# Patient Record
Sex: Male | Born: 1960
Health system: Southern US, Community
[De-identification: ages and names within clinical notes are randomized; demographics above are authoritative.]

## PROBLEM LIST (undated history)

## (undated) DIAGNOSIS — I10 Essential (primary) hypertension: Secondary | ICD-10-CM

## (undated) DIAGNOSIS — G7000 Myasthenia gravis without (acute) exacerbation: Secondary | ICD-10-CM

## (undated) DIAGNOSIS — E7800 Pure hypercholesterolemia, unspecified: Secondary | ICD-10-CM

## (undated) DIAGNOSIS — E119 Type 2 diabetes mellitus without complications: Secondary | ICD-10-CM

## (undated) HISTORY — DX: Essential (primary) hypertension: I10

## (undated) HISTORY — PX: WISDOM TOOTH EXTRACTION: SHX21

## (undated) HISTORY — DX: Myasthenia gravis without (acute) exacerbation: G70.00

## (undated) HISTORY — DX: Pure hypercholesterolemia, unspecified: E78.00

## (undated) HISTORY — PX: ELBOW SURGERY: SHX618

## (undated) HISTORY — DX: Type 2 diabetes mellitus without complications: E11.9

## (undated) HISTORY — PX: TYMPANOSTOMY TUBE PLACEMENT: SHX32

---

## 2003-06-29 ENCOUNTER — Ambulatory Visit (HOSPITAL_COMMUNITY): Admission: RE | Admit: 2003-06-29 | Discharge: 2003-06-29 | Payer: Self-pay | Admitting: Unknown Physician Specialty

## 2003-06-29 ENCOUNTER — Encounter: Payer: Self-pay | Admitting: Unknown Physician Specialty

## 2005-07-08 ENCOUNTER — Ambulatory Visit: Payer: Self-pay | Admitting: Family Medicine

## 2005-12-09 ENCOUNTER — Ambulatory Visit: Payer: Self-pay | Admitting: Family Medicine

## 2005-12-15 ENCOUNTER — Ambulatory Visit: Payer: Self-pay | Admitting: Family Medicine

## 2006-02-19 ENCOUNTER — Ambulatory Visit: Payer: Self-pay | Admitting: Family Medicine

## 2006-04-09 ENCOUNTER — Ambulatory Visit: Payer: Self-pay | Admitting: Family Medicine

## 2006-12-30 ENCOUNTER — Ambulatory Visit: Payer: Self-pay | Admitting: Family Medicine

## 2013-02-14 DIAGNOSIS — I10 Essential (primary) hypertension: Secondary | ICD-10-CM | POA: Insufficient documentation

## 2016-07-01 DIAGNOSIS — E785 Hyperlipidemia, unspecified: Secondary | ICD-10-CM | POA: Insufficient documentation

## 2016-07-01 DIAGNOSIS — F17210 Nicotine dependence, cigarettes, uncomplicated: Secondary | ICD-10-CM | POA: Insufficient documentation

## 2017-12-15 DIAGNOSIS — Z6832 Body mass index (BMI) 32.0-32.9, adult: Secondary | ICD-10-CM | POA: Diagnosis not present

## 2017-12-15 DIAGNOSIS — M25522 Pain in left elbow: Secondary | ICD-10-CM | POA: Diagnosis not present

## 2017-12-15 DIAGNOSIS — M7712 Lateral epicondylitis, left elbow: Secondary | ICD-10-CM | POA: Diagnosis not present

## 2018-01-15 DIAGNOSIS — E785 Hyperlipidemia, unspecified: Secondary | ICD-10-CM | POA: Diagnosis not present

## 2018-01-15 DIAGNOSIS — R799 Abnormal finding of blood chemistry, unspecified: Secondary | ICD-10-CM | POA: Diagnosis not present

## 2018-01-20 DIAGNOSIS — Z0000 Encounter for general adult medical examination without abnormal findings: Secondary | ICD-10-CM | POA: Diagnosis not present

## 2018-01-20 DIAGNOSIS — E785 Hyperlipidemia, unspecified: Secondary | ICD-10-CM | POA: Diagnosis not present

## 2018-01-20 DIAGNOSIS — I10 Essential (primary) hypertension: Secondary | ICD-10-CM | POA: Diagnosis not present

## 2018-01-20 DIAGNOSIS — F17210 Nicotine dependence, cigarettes, uncomplicated: Secondary | ICD-10-CM | POA: Diagnosis not present

## 2018-05-13 DIAGNOSIS — I10 Essential (primary) hypertension: Secondary | ICD-10-CM | POA: Diagnosis not present

## 2018-05-31 DIAGNOSIS — H35033 Hypertensive retinopathy, bilateral: Secondary | ICD-10-CM | POA: Diagnosis not present

## 2018-06-03 DIAGNOSIS — F17210 Nicotine dependence, cigarettes, uncomplicated: Secondary | ICD-10-CM | POA: Diagnosis not present

## 2018-06-03 DIAGNOSIS — I10 Essential (primary) hypertension: Secondary | ICD-10-CM | POA: Diagnosis not present

## 2018-06-03 DIAGNOSIS — K219 Gastro-esophageal reflux disease without esophagitis: Secondary | ICD-10-CM | POA: Diagnosis not present

## 2018-06-03 DIAGNOSIS — E785 Hyperlipidemia, unspecified: Secondary | ICD-10-CM | POA: Diagnosis not present

## 2018-06-07 DIAGNOSIS — H5310 Unspecified subjective visual disturbances: Secondary | ICD-10-CM | POA: Diagnosis not present

## 2018-06-07 DIAGNOSIS — H5010 Unspecified exotropia: Secondary | ICD-10-CM | POA: Diagnosis not present

## 2018-06-07 DIAGNOSIS — H04123 Dry eye syndrome of bilateral lacrimal glands: Secondary | ICD-10-CM | POA: Diagnosis not present

## 2018-06-07 DIAGNOSIS — H532 Diplopia: Secondary | ICD-10-CM | POA: Diagnosis not present

## 2018-09-27 DIAGNOSIS — I10 Essential (primary) hypertension: Secondary | ICD-10-CM | POA: Diagnosis not present

## 2018-09-27 DIAGNOSIS — E785 Hyperlipidemia, unspecified: Secondary | ICD-10-CM | POA: Diagnosis not present

## 2018-09-30 DIAGNOSIS — I10 Essential (primary) hypertension: Secondary | ICD-10-CM | POA: Diagnosis not present

## 2018-09-30 DIAGNOSIS — E785 Hyperlipidemia, unspecified: Secondary | ICD-10-CM | POA: Diagnosis not present

## 2018-09-30 DIAGNOSIS — K219 Gastro-esophageal reflux disease without esophagitis: Secondary | ICD-10-CM | POA: Diagnosis not present

## 2018-09-30 DIAGNOSIS — F17210 Nicotine dependence, cigarettes, uncomplicated: Secondary | ICD-10-CM | POA: Diagnosis not present

## 2018-11-30 DIAGNOSIS — H539 Unspecified visual disturbance: Secondary | ICD-10-CM | POA: Diagnosis not present

## 2018-11-30 DIAGNOSIS — I10 Essential (primary) hypertension: Secondary | ICD-10-CM | POA: Diagnosis not present

## 2018-11-30 DIAGNOSIS — H532 Diplopia: Secondary | ICD-10-CM | POA: Diagnosis not present

## 2018-12-01 DIAGNOSIS — H539 Unspecified visual disturbance: Secondary | ICD-10-CM | POA: Diagnosis not present

## 2018-12-01 DIAGNOSIS — H532 Diplopia: Secondary | ICD-10-CM | POA: Diagnosis not present

## 2018-12-02 DIAGNOSIS — I10 Essential (primary) hypertension: Secondary | ICD-10-CM | POA: Diagnosis not present

## 2018-12-02 DIAGNOSIS — E785 Hyperlipidemia, unspecified: Secondary | ICD-10-CM | POA: Diagnosis not present

## 2018-12-07 DIAGNOSIS — H02401 Unspecified ptosis of right eyelid: Secondary | ICD-10-CM | POA: Diagnosis not present

## 2018-12-22 ENCOUNTER — Encounter: Payer: Self-pay | Admitting: *Deleted

## 2018-12-23 ENCOUNTER — Other Ambulatory Visit: Payer: Self-pay

## 2018-12-23 ENCOUNTER — Encounter: Payer: Self-pay | Admitting: *Deleted

## 2018-12-23 ENCOUNTER — Ambulatory Visit: Payer: BLUE CROSS/BLUE SHIELD | Admitting: Neurology

## 2018-12-23 ENCOUNTER — Encounter: Payer: Self-pay | Admitting: Neurology

## 2018-12-23 VITALS — BP 165/85 | HR 65 | Temp 98.0°F | Ht 68.0 in | Wt 206.0 lb

## 2018-12-23 DIAGNOSIS — I10 Essential (primary) hypertension: Secondary | ICD-10-CM

## 2018-12-23 DIAGNOSIS — H05823 Myopathy of extraocular muscles, bilateral: Secondary | ICD-10-CM

## 2018-12-23 DIAGNOSIS — H02401 Unspecified ptosis of right eyelid: Secondary | ICD-10-CM

## 2018-12-23 DIAGNOSIS — H532 Diplopia: Secondary | ICD-10-CM

## 2018-12-23 DIAGNOSIS — Z87891 Personal history of nicotine dependence: Secondary | ICD-10-CM

## 2018-12-23 DIAGNOSIS — G7000 Myasthenia gravis without (acute) exacerbation: Secondary | ICD-10-CM

## 2018-12-23 MED ORDER — PYRIDOSTIGMINE BROMIDE 60 MG PO TABS: ORAL_TABLET | ORAL | 6 refills | Status: DC

## 2018-12-23 NOTE — Patient Instructions (Signed)
MRI of the brain CT of the chest Labwork today Start Mestinon(pyridostigmine) and may consider other medications after evaluation above    Myasthenia Gravis Myasthenia gravis (MG) is a long-term (chronic) condition that causes weakness in the muscles you can control (voluntary muscles). MG can affect any voluntary muscle. The muscles most often affected are the ones that control:  Eye movement.  Facial movements.  Swallowing. MG is a disease in which the body's disease-fighting system (immune system) attacks its own healthy tissues (autoimmune disease). When you have MG, your immune system makes proteins (antibodies) that block the chemical (acetylcholine) that your body needs to send nerve signals to your muscles. This causes muscle weakness. What are the causes? The exact cause of MG is not known. What increases the risk? The following factors may make you more likely to develop this condition:  Having an enlarged thymus gland. The thymus gland is located under the breastbone. It makes certain cells for the immune system.  Having a family history of MG. What are the signs or symptoms? Symptoms of MG may include:  Drooping eyelids.  Double vision.  Muscle weakness that gets worse with activity and gets better after rest.  Difficulty walking.  Trouble chewing and swallowing.  Trouble making facial expressions.  Slurred speech.  Weakness of the arms, hands, and legs. Sudden, severe difficulty breathing (myasthenic crisis) may develop after having:  An infection.  A fever.  A bad reaction to a medicine. Myasthenic crisis requires emergency breathing support. Sometimes symptoms of MG go away for a while (remission) and then come back later. How is this diagnosed? This condition may be diagnosed based on:  Your symptoms and medical history.  A physical exam.  Blood tests.  Tests of your muscle strength and function.  Imaging tests, such as a CT scan or an  MRI. How is this treated? The goal of treatment is to improve muscle strength. Treatment may include:  Taking medicine.  Making lifestyle changes that focus on saving your energy.  Doing physical therapy to gain strength.  Having surgery to remove the thymus gland (thymectomy). This may result in a long remission for some people.  Having a procedure to remove the acetylcholine antibodies (plasmapheresis).  Getting emergency breathing support, if you experience myasthenic crisis. If you experience remission, you may be able to stop treatment and then resume treatment when your symptoms return. Follow these instructions at home:   Take over-the-counter and prescription medicines only as told by your health care provider.  Get plenty of rest and sleep. Take frequent breaks to rest your eyes, especially when in bright light or working on a computer.  Maintain a healthy diet and a healthy weight. Work with your health care provider or a diet and nutrition specialist (dietitian) if you need help.  Do exercises as told by your health care provider or physical therapist.  Do not use any products that contain nicotine or tobacco, such as cigarettes and e-cigarettes. If you need help quitting, ask your health care provider.  Prevent infections by: ? Washing your hands often with soap and water. If soap and water are not available, use hand sanitizer. ? Avoiding contact with other people who are sick. ? Avoiding touching your eyes, nose, and mouth. ? Cleaning surfaces in your home that are touched often using a disinfectant.  Keep all follow-up visits as told by your health care provider. This is important. Contact a health care provider if:  Your symptoms change or get worse,  especially after having a fever or infection. Get help right away if:  You have trouble breathing. Summary  Myasthenia gravis (MG) is a long-term (chronic) condition that causes weakness in the muscles you can  control (voluntary muscles).  A symptom of MG is muscle weakness that gets worse with activity and gets better after rest.  Sudden, severe difficulty breathing (myasthenic crisis) may develop after having an infection, a fever, or a bad reaction to a medicine.  The goal of treatment is to improve muscle strength. Treatment may include medicines, lifestyle changes, physical therapy, surgery, plasmapheresis, or emergency breathing support. This information is not intended to replace advice given to you by your health care provider. Make sure you discuss any questions you have with your health care provider. Document Released: 12/15/2000 Document Revised: 09/21/2017 Document Reviewed: 09/21/2017 Elsevier Interactive Patient Education  2019 [employer].   Pyridostigmine tablets, regular release What is this medicine? PYRIDOSTIGMINE (peer id oh STIG meen) can help with muscle strength. It is used to treat myasthenia gravis. This medicine may be used for other purposes; ask your health care provider or pharmacist if you have questions. COMMON BRAND NAME(S): Mestinon What should I tell my health care provider before I take this medicine? They need to know if you have any of these conditions: -asthma -difficulty passing urine -heart disease -infection in abdomen, peritonitis -irregular, slow heartbeat -kidney disease -seizures -stomach or bowel obstruction or ulcers -thyroid disease -an unusual or allergic reaction to pyridostigmine, bromides, other medicines, foods, dyes, or preservatives -pregnant or trying to get pregnant -breast-feeding How should I use this medicine? Take this medicine by mouth with a glass of water. Follow the directions on the prescription label. Take your medicine at regular intervals. Do not take your medicine more often than directed. Do not stop taking except on your doctor's advice. Talk to your pediatrician regarding the use of this medicine in children.  Special care may be needed. Overdosage: If you think you have taken too much of this medicine contact a poison control center or emergency room at once. NOTE: This medicine is only for you. Do not share this medicine with others. What if I miss a dose? If you miss a dose, take it as soon as you can. If it is almost time for your next dose, take only that dose. Do not take double or extra doses. What may interact with this medicine? Do not take this medicine with any of the following medications: -other medicines for myasthenia gravis like neostigmine -quinine This medicine may also interact with the following medications: -atropine -bethanechol -disopyramide -edrophonium -guanadrel -guanethidine -mecamylamine -medicines that block muscle or nerve pain This list may not describe all possible interactions. Give your health care provider a list of all the medicines, herbs, non-prescription drugs, or dietary supplements you use. Also tell them if you smoke, drink alcohol, or use illegal drugs. Some items may interact with your medicine. What should I watch for while using this medicine? Visit your doctor or health care professional for regular checks on your progress. Tell your doctor if your symptoms do not improve or if they get worse. Wear a medical ID bracelet or chain, and carry a card that describes your disease and details of your medicine and dosage times. What side effects may I notice from receiving this medicine? Side effects that you should report to your doctor or health care professional as soon as possible: -allergic reactions like skin rash, itching or hives, swelling of the face,  lips, or tongue -breathing problems -changes in vision -muscle cramps, spasm -slow or irregular heartbeat -stomach cramps, pain -unusually weak or tired -vomiting Side effects that usually do not require medical attention (report to your doctor or health care professional if they continue or are  bothersome): -diarrhea, especially at start of treatment -increased saliva -increased sweating -nausea This list may not describe all possible side effects. Call your doctor for medical advice about side effects. You may report side effects to FDA at 1-800-FDA-1088. Where should I keep my medicine? Keep out of the reach of children. Store at room temperature between 15 and 30 degrees C (59 and 86 degrees F). Keep container tightly closed. Throw away any unused medicine after the expiration date. NOTE: This sheet is a summary. It may not cover all possible information. If you have questions about this medicine, talk to your doctor, pharmacist, or health care provider.  2019 Elsevier/Gold Standard (2008-04-06 15:11:50)
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
## 2018-12-27 ENCOUNTER — Encounter: Payer: Self-pay | Admitting: Neurology

## 2018-12-27 DIAGNOSIS — I10 Essential (primary) hypertension: Secondary | ICD-10-CM

## 2018-12-27 DIAGNOSIS — G7000 Myasthenia gravis without (acute) exacerbation: Secondary | ICD-10-CM | POA: Insufficient documentation

## 2018-12-28 ENCOUNTER — Telehealth: Payer: Self-pay | Admitting: Neurology

## 2018-12-28 NOTE — Telephone Encounter (Signed)
Noted, thank you

## 2018-12-28 NOTE — Telephone Encounter (Signed)
[employer] Auth: [id number] (exp. 12/28/18 to 06/25/19) order sent to GI. They will reach out to the pt to schedule.

## 2018-12-28 NOTE — Telephone Encounter (Signed)
Are you doing the CT Chest w contrast for ocular Myasthenia gravis or what is the reason?

## 2018-12-28 NOTE — Telephone Encounter (Signed)
Yes mtasthenia gravis looking for thymoma or malignancy that can produce the antibodies that cause the disease

## 2018-12-30 LAB — HEMOGLOBIN A1C
Est. average glucose Bld gHb Est-mCnc: 128 mg/dL
Hgb A1c MFr Bld: 6.1 % — ABNORMAL HIGH (ref 4.8–5.6)

## 2018-12-30 LAB — VGCC ANTIBODY: VGCC Antibody: NEGATIVE

## 2018-12-30 LAB — ACETYLCHOLINE RECEPTOR, BLOCKING: Acetylchol Block Ab: 25 % (ref 0–25)

## 2018-12-30 LAB — B12 AND FOLATE PANEL
Folate: 6.2 ng/mL (ref >3.0)
Vitamin B-12: 559 pg/mL (ref 232–1245)

## 2018-12-30 LAB — CK: Total CK: 217 U/L — ABNORMAL HIGH (ref 24–204)

## 2018-12-30 LAB — METHYLMALONIC ACID, SERUM: Methylmalonic Acid: 277 nmol/L (ref 0–378)

## 2018-12-30 LAB — ACETYLCHOLINE RECEPTOR, MODULATING: Acetylcholine Modulat Ab: <12 % (ref 0–20)

## 2018-12-30 LAB — VITAMIN B1: Thiamine: 160.1 nmol/L (ref 66.5–200.0)

## 2018-12-30 LAB — SEDIMENTATION RATE: Sed Rate: 22 mm/hr (ref 0–30)

## 2018-12-30 LAB — C-REACTIVE PROTEIN: CRP: 1 mg/L (ref 0–10)

## 2018-12-30 LAB — VITAMIN B6: Vitamin B6: 8.6 ug/L (ref 5.3–46.7)

## 2018-12-30 LAB — ACETYLCHOLINE RECEPTOR, BINDING: AChR Binding Ab, Serum: <0.03 nmol/L (ref 0.00–0.24)

## 2019-01-03 ENCOUNTER — Ambulatory Visit
Admission: RE | Admit: 2019-01-03 | Discharge: 2019-01-03 | Disposition: A | Discharge status: Home / Self Care | Payer: BLUE CROSS/BLUE SHIELD | Source: Ambulatory Visit | Attending: Neurology | Admitting: Neurology

## 2019-01-03 ENCOUNTER — Encounter: Payer: Self-pay | Admitting: *Deleted

## 2019-01-03 ENCOUNTER — Telehealth: Payer: Self-pay | Admitting: *Deleted

## 2019-01-03 ENCOUNTER — Other Ambulatory Visit: Payer: Self-pay

## 2019-01-03 DIAGNOSIS — H02401 Unspecified ptosis of right eyelid: Secondary | ICD-10-CM

## 2019-01-03 DIAGNOSIS — H532 Diplopia: Secondary | ICD-10-CM

## 2019-01-03 DIAGNOSIS — H05823 Myopathy of extraocular muscles, bilateral: Secondary | ICD-10-CM

## 2019-01-03 DIAGNOSIS — Z87891 Personal history of nicotine dependence: Secondary | ICD-10-CM

## 2019-01-03 DIAGNOSIS — I700 Atherosclerosis of aorta: Secondary | ICD-10-CM | POA: Diagnosis not present

## 2019-01-03 MED ORDER — IOPAMIDOL (ISOVUE-300) INJECTION 61%
75.0000 mL | Freq: Once | INTRAVENOUS | Status: AC | PRN
  Administered 2019-01-03: 75 mL via INTRAVENOUS

## 2019-01-03 MED ORDER — GADOBENATE DIMEGLUMINE 529 MG/ML IV SOLN
19.0000 mL | Freq: Once | INTRAVENOUS | Status: AC | PRN
  Administered 2019-01-03: 12:00:00 19 mL via INTRAVENOUS

## 2019-01-03 NOTE — Telephone Encounter (Signed)
-----   Message from Melvenia Beam, MD sent at 01/02/2019 10:44 AM EDT ----- Patient has pre-diabetes. The rest of the labs were unremarkable. One lab for Myasthenia Gravis was borderline. As explained, the myasthenia gravis labs are often negative in ocular myasthenia gravis. We still have MRI pending. thanks

## 2019-01-03 NOTE — Telephone Encounter (Signed)
Messaged pt through his active mychart.

## 2019-01-04 NOTE — Telephone Encounter (Signed)
Faxed work [employer] to [employer] per [employer] request. Received a receipt of confirmation.

## 2019-01-05 ENCOUNTER — Encounter: Payer: Self-pay | Admitting: *Deleted

## 2019-01-10 ENCOUNTER — Ambulatory Visit (INDEPENDENT_AMBULATORY_CARE_PROVIDER_SITE_OTHER): Payer: BLUE CROSS/BLUE SHIELD | Admitting: Neurology

## 2019-01-10 ENCOUNTER — Other Ambulatory Visit: Payer: Self-pay

## 2019-01-10 DIAGNOSIS — G7000 Myasthenia gravis without (acute) exacerbation: Secondary | ICD-10-CM
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
## 2019-01-10 NOTE — Telephone Encounter (Signed)
Called pt & LVM asking for call back to update his chart before his phone appt with Dr. Jaynee Eagles at 11:30 this morning. Left office number for call back.

## 2019-01-11 ENCOUNTER — Encounter: Payer: Self-pay | Admitting: Neurology

## 2019-01-12 DIAGNOSIS — Z0289 Encounter for other administrative examinations: Secondary | ICD-10-CM

## 2019-01-12 NOTE — Telephone Encounter (Signed)
FMLA form for absence from work (12 weeks) starting 12/23/2018 with estimated return to work date 03/24/2019 completed, signed and sent to medical records for processing.

## 2019-01-13 ENCOUNTER — Telehealth: Payer: Self-pay | Admitting: *Deleted

## 2019-01-13 NOTE — Telephone Encounter (Signed)
Pt [employer] form faxed on 01/13/19

## 2019-01-17 ENCOUNTER — Encounter: Payer: Self-pay | Admitting: *Deleted

## 2019-01-17 ENCOUNTER — Telehealth: Payer: Self-pay | Admitting: Neurology

## 2019-01-17 NOTE — Telephone Encounter (Signed)
Pt states that the pyridostigmine (MESTINON) 60 MG tablet is not working as it did before. Pt was to f/u in 4 weeks but is thinking he should be seen sooner due to the medication not working the way it was. Please call daughter per pt. Please advise.

## 2019-01-17 NOTE — Telephone Encounter (Signed)
I sent pt a mychart message asking for an updated number for his daughter.

## 2019-01-17 NOTE — Telephone Encounter (Signed)
Spoke with pt's daughter Enriqueta Shutter. Per daughter, pt started 1/2 tablet of mestinon q3 hours for 3-4 weeks, doing ok on it, then felt like his body was used to it. Increased to 1 tablet every 3 hours however that's not helping as good as original use of 1/2 tablet. Pt also wants to know when to f/u. Per Joycelyn Schmid, pt to return to work May 18th but was under impression appt was needed before then. Also made note that if patient bends over and then sits back up or is reclining and sits up, he gets blurred vision in his R eye. Closes eye 1-2 minutes then it's better. He is not dizzy or "swimmy headed" when this happens. I advised message would be sent to Dr. Jaynee Eagles and I will call back. She verbalized appreciation.

## 2019-01-18 ENCOUNTER — Telehealth: Payer: Self-pay | Admitting: Neurology

## 2019-01-18 ENCOUNTER — Other Ambulatory Visit: Payer: Self-pay | Admitting: Neurology

## 2019-01-18 DIAGNOSIS — H5460 Unqualified visual loss, one eye, unspecified: Secondary | ICD-10-CM

## 2019-01-18 DIAGNOSIS — G453 Amaurosis fugax: Secondary | ICD-10-CM

## 2019-01-18 DIAGNOSIS — H53122 Transient visual loss, left eye: Secondary | ICD-10-CM

## 2019-01-18 NOTE — Telephone Encounter (Signed)
[employer] Auth: [id number] (exp. 01/18/19 to 07/16/19) order sent to [facility]. They will reach out to the pt to schedule.

## 2019-01-18 NOTE — Telephone Encounter (Signed)
I would like to see him in the office early next week. I need more information about his blurry vision. I would advice increased fluid as it sounds orthostatic. But I would like to make sure the blood vessels in his head are ok, if there is a lood vessel issue it can cause blurry vision when not enough blood can get to the eye. I will order a CTA of the head and neck thanks

## 2019-01-18 NOTE — Telephone Encounter (Signed)
I spoke with pt's daughter Enriqueta Shutter. Discussed Dr. Cathren Laine message in detail including encouragement to drink more fluids in case this is orthostatic, also ordered CT-A to check the blood vessels in head and neck as decreased blood flow to eye can cause blurred vision. She verbalized understanding and will let pt know. She confirmed pt hasn't had any fever, respiratory symptoms, has not traveled, only left house to go to the store, no known exposure to [place]. Per Dr.Ahern, CT-A not needed before visit. Scheduled pt for ov Monday 5/4 @ 11:00 AM. She is aware they will need to bring their own mask and gloves and visitor accompaniment is limited, only pt allowed back. She verbalized appreciation for the call.

## 2019-01-24 ENCOUNTER — Ambulatory Visit
Admission: RE | Admit: 2019-01-24 | Discharge: 2019-01-24 | Disposition: A | Discharge status: Home / Self Care | Payer: BLUE CROSS/BLUE SHIELD | Source: Ambulatory Visit | Attending: Neurology | Admitting: Neurology

## 2019-01-24 ENCOUNTER — Other Ambulatory Visit: Payer: BLUE CROSS/BLUE SHIELD

## 2019-01-24 ENCOUNTER — Ambulatory Visit: Payer: BLUE CROSS/BLUE SHIELD | Admitting: Neurology

## 2019-01-24 ENCOUNTER — Encounter: Payer: Self-pay | Admitting: *Deleted

## 2019-01-24 ENCOUNTER — Encounter: Payer: Self-pay | Admitting: Neurology

## 2019-01-24 ENCOUNTER — Other Ambulatory Visit: Payer: Self-pay

## 2019-01-24 VITALS — BP 143/87 | HR 62 | Temp 99.5°F

## 2019-01-24 DIAGNOSIS — H53122 Transient visual loss, left eye: Secondary | ICD-10-CM

## 2019-01-24 DIAGNOSIS — H5460 Unqualified visual loss, one eye, unspecified: Secondary | ICD-10-CM

## 2019-01-24 DIAGNOSIS — G7000 Myasthenia gravis without (acute) exacerbation: Secondary | ICD-10-CM | POA: Diagnosis not present

## 2019-01-24 DIAGNOSIS — Z79899 Other long term (current) drug therapy: Secondary | ICD-10-CM

## 2019-01-24 DIAGNOSIS — G453 Amaurosis fugax: Secondary | ICD-10-CM | POA: Diagnosis not present

## 2019-01-24 MED ORDER — MYCOPHENOLATE MOFETIL 500 MG PO TABS: 500.0000 mg | ORAL_TABLET | Freq: Two times a day (BID) | ORAL | 6 refills | Status: DC

## 2019-01-24 MED ORDER — IOPAMIDOL (ISOVUE-370) INJECTION 76%
75.0000 mL | Freq: Once | INTRAVENOUS | Status: AC | PRN
  Administered 2019-01-24: 75 mL via INTRAVENOUS
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
## 2019-01-24 NOTE — Patient Instructions (Signed)
Treatment Strategies & Goals Myasthenia gravis (pronounced `my??s?`th??n???? `gr?v??s), also known simply as MG, is a rare neuromuscular disorder. When the first case of MG was documented in 1672 by Mason Jim, an [facility] physician, not much was known or understood about it. Today, we know there are multiple causes for MG as well as treatment options. The most common form of MG is a chronic autoimmune neuromuscular disorder that is characterized by fluctuating weakness of the voluntary muscle groups.     Treatment Goals While there is no known cure for myasthenia gravis (MG), there are many effective treatments. Spontaneous improvement and even remission, although uncommon, may occur without any specific therapy. However, as every case of MG is unique, you and your doctor will decide on a treatment plan for your specific needs.     In preparation for your doctor's visit, you can review and bring a copy of the MG treatment guidelines with you. The MG treatment guidelines are the result of a three-year effort to develop agreement among an international group of MG experts on the use of various treatments for people with MG. These guidelines were developed with leadership from our Medical and Scientific Advisory Board members and published in the April 17, 2015 issue of Neurology, entitled the "International Consensus Guidance for Management of Myasthenia Gravis." This paper is a significant new resource for physicians caring for MG patients. To view the webinar on MG Treatment Guidelines, please click here.     With treatment, you may expect to see modest to significant improvement in your strength, helping you lead a full life. New treatments continue to give hope to people with MG, their family and their caregivers. Information on available treatments is below.     Medications Anti-acetylcholinesterase agents - Mestinon  (pyridiostigmine bromide)- allows acetylcholine to remain at the  neuromuscular junction for a longer period, which in turn allows activation of more receptor sites, resulting in increased conductivity and muscle engagement. Mestinon comes in two forms, fast-acting 60 mg tablets and long-lasting slow-release 180 mg capsules known as Timespan, which delivers pyridiostigmine bromide over a 12-hour period.  Corticosteroids and immunosuppressant agents -  Corticosteroids, such as Prednisone or an immunosuppressant agent such as Imuran, Cellcept, or Cyclosporin , may be prescribed by your doctor as a stand-alone medication or in combination. These medications suppress your body's production of antibodies that may be blocking or binding onto your body's acetylcholine receptors. This blocking or binding of the acetylcholine receptors causes weakness. Monoclonal antibodies - Monoclonal antibodies such as Soliris, generic name Eculizumab or Rituximab, are the newest classification of infusible drugs to be FDA-approved for MG treatment. These drugs work as complement inhibitors, to reduce immune system attacks that may contribute to MG symptoms. You and your doctor will decide if this approach is best for you.  Cautionary Drugs - Certain medications may cause worsening of MG symptoms. Remember to tell your doctors and dentists about your MG diagnosis and the medications you are currently taking. It is important to check with your doctor before starting any new medication, including over the counter medications or preparations.   Other Treatment Strategies Intravenous immune globulins (IVIg) - Your doctor may prescribe IVIG as part of your treatment regimen. During IVIG, a line is placed into a vein to receive delivery of immune globulins (IgG). A typical IVIG infusion may take from 4- 8 hours and is typically in a hospital setting. This influx of IgG is thought to override your own antibody production (which may be  causing your weakness) while providing you protection from  possible infections. Results are often temporary, so repeated treatments are required. IgG Sub-cue Hizentra- A new, less invasive method of delivering immune globulins (IgG) is being reviewed for MG use. This method is known as IgG sub-cue, which means subcutaneous or "under the skin". In this method of IgG delivery, a series of 4-6 short needles are placed into the subcutaneous layer of skin across your abdomen. These needles are connected in a spider web fashion to the pump. A typical sub-cue infusion may only take from 1-3 hours and can be done at home. Therapeutic Plasma Exchange, or Plasmapheresis - Also known as Plasma exchange or PLEX. This is a filtration procedure whereby abnormal antibodies are removed from blood plasma. This procedure requires two intravenous (IV) lines or a port placed before undergoing PLEX.  Your doctor may decide you need this treatment to quickly improve strength prior to surgery.  As your body continually produces antibodies, repeated PLEX treatments may be required. Thymectomy - This is the surgical removal of the thymus gland. The thymus gland is located in the middle of your upper chest and lies over your heart. This gland plays a role in the production of antibodies. While it is most active in early childhood, the thymus gland usually shrinks over time and by early adulthood is believed to no longer function. Sometimes, the thymus gland remains large and continues to be active in antibody production. Some MG patients may develop a tumor in the thymus gland know as a thymoma. Your doctor may suggest a thymectomy for you.   Effectiveness of this surgical procedure varies with each patient. Weakness may be reduced, and sometimes  Mycophenolate capsules What is this medicine? MYCOPHENOLATE MOFETIL (mye koe FEN oh late MOE fe til) is used to decrease the immune system's response to a transplanted organ. This medicine may be used for other purposes; ask your health care  provider or pharmacist if you have questions. COMMON BRAND NAME(S): CellCept What should I tell my health care provider before I take this medicine? They need to know if you have any of these conditions: -anemia or other blood disorder -diarrhea -immune system problems -infection -kidney disease -phenylketonuria -stomach problems -an unusual or allergic reaction to mycophenolate mofetil, other medicines, foods, dyes, or preservatives -pregnant or trying to get pregnant -breast-feeding How should I use this medicine? Take this medicine by mouth with a full glass of water. Follow the directions on the prescription label. Take this medicine on an empty stomach, at least 1 hour before or 2 hours after food. Do not take with food unless your doctor approves. Swallow the medicine whole. Do not cut, crush, or chew the medicine. If the medicine is broken or is not intact, do not get the powder on your skin or eyes. If contact occurs, rinse thoroughly with water. Take your medicine at regular intervals. Do not take your medicine more often than directed. Do not stop taking except on your doctor's advice. A special MedGuide will be given to you by the pharmacist with each prescription and refill. Be sure to read this information carefully each time. Talk to your pediatrician regarding the use of this medicine in children. Special care may be needed. Overdosage: If you think you have taken too much of this medicine contact a poison control center or emergency room at once. NOTE: This medicine is only for you. Do not share this medicine with others. What if I miss a dose? If  you miss a dose, take it as soon as you can. If it is almost time for your next dose, take only that dose. Do not take double or extra doses. What may interact with this medicine? -acyclovir or valacyclovir -antacids -azathioprine -birth control pills -certain antibiotics like ciprofloxacin and amoxicillin; clavulanic  acid -ganciclovir or valganciclovir -lanthanum carbonate -medicines for cholesterol like cholestyramine and colestipol -metronidazole -norfloxacin -other mycophenolate medicines -probenecid -rifampin -sevelamer -vaccines This list may not describe all possible interactions. Give your health care provider a list of all the medicines, herbs, non-prescription drugs, or dietary supplements you use. Also tell them if you smoke, drink alcohol, or use illegal drugs. Some items may interact with your medicine. What should I watch for while using this medicine? Visit your doctor or health care professional for regular checks on your progress. You will need frequent blood checks during the first few months you are receiving the medicine. This medicine can make you more sensitive to the sun. Keep out of the sun. If you cannot avoid being in the sun, wear protective clothing and use sunscreen. Do not use sun lamps or tanning beds/booths. This medicine can cause birth defects. Do not get pregnant while taking this drug. Females will need to have a negative pregnancy test before starting this medicine. If sexually active, use 2 reliable forms of birth control together for 4 weeks before starting this medicine, while you are taking this medicine, and for 6 weeks after you stop taking this medicine. Birth control pills alone may not work properly while you are taking this medicine. If you think that you might be pregnant talk to your doctor right away. If you get a cold or other infection while receiving this medicine, call your doctor or health care professional. Do not treat yourself. The medicine may decrease your body's ability to fight infections. What side effects may I notice from receiving this medicine? Side effects that you should report to your doctor or health care professional as soon as possible: -allergic reactions like skin rash, itching or hives, swelling of the face, lips, or tongue -bloody,  dark, or tarry stools -changes in vision -dizziness -fever, chills or any other sign of infection -unusual bleeding or bruising -unusually weak or tired Side effects that usually do not require medical attention (report to your doctor or health care professional if they continue or are bothersome): -constipation -diarrhea -difficulty sleeping -loss of appetite -nausea, vomiting This list may not describe all possible side effects. Call your doctor for medical advice about side effects. You may report side effects to FDA at 1-800-FDA-1088. Where should I keep my medicine? Keep out of the reach of children. Store at room temperature between 15 and 30 degrees C (59 and 86 degrees F). Throw away any unused medicine after the expiration date. NOTE: This sheet is a summary. It may not cover all possible information. If you have questions about this medicine, talk to your doctor, pharmacist, or health care provider.  2019 Elsevier/Gold Standard (2008-03-20 09:25:30)

## 2019-01-25 LAB — CBC WITH DIFFERENTIAL/PLATELET
Basophils Absolute: 0.1 10*3/uL (ref 0.0–0.2)
Basos: 1 %
EOS (ABSOLUTE): 0.3 10*3/uL (ref 0.0–0.4)
Eos: 3 %
Hematocrit: 49.6 % (ref 37.5–51.0)
Hemoglobin: 17.2 g/dL (ref 13.0–17.7)
Immature Grans (Abs): 0 10*3/uL (ref 0.0–0.1)
Immature Granulocytes: 0 %
Lymphocytes Absolute: 2.3 10*3/uL (ref 0.7–3.1)
Lymphs: 22 %
MCH: 30.8 pg (ref 26.6–33.0)
MCHC: 34.7 g/dL (ref 31.5–35.7)
MCV: 89 fL (ref 79–97)
Monocytes Absolute: 0.5 10*3/uL (ref 0.1–0.9)
Monocytes: 5 %
Neutrophils Absolute: 7.3 10*3/uL — ABNORMAL HIGH (ref 1.4–7.0)
Neutrophils: 69 %
Platelets: 269 10*3/uL (ref 150–450)
RBC: 5.58 x10E6/uL (ref 4.14–5.80)
RDW: 13.5 % (ref 11.6–15.4)
WBC: 10.4 10*3/uL (ref 3.4–10.8)

## 2019-01-25 LAB — COMPREHENSIVE METABOLIC PANEL
ALT: 25 IU/L (ref 0–44)
AST: 31 IU/L (ref 0–40)
Albumin/Globulin Ratio: 2.1 (ref 1.2–2.2)
Albumin: 4.9 g/dL (ref 3.8–4.9)
Alkaline Phosphatase: 93 IU/L (ref 39–117)
BUN/Creatinine Ratio: 15 (ref 9–20)
BUN: 12 mg/dL (ref 6–24)
Bilirubin Total: 0.6 mg/dL (ref 0.0–1.2)
CO2: 20 mmol/L (ref 20–29)
Calcium: 10.2 mg/dL (ref 8.7–10.2)
Chloride: 99 mmol/L (ref 96–106)
Creatinine, Ser: 0.78 mg/dL (ref 0.76–1.27)
GFR calc Af Amer: 116 mL/min/{1.73_m2} (ref >59)
GFR calc non Af Amer: 100 mL/min/{1.73_m2} (ref >59)
Globulin, Total: 2.3 g/dL (ref 1.5–4.5)
Glucose: 97 mg/dL (ref 65–99)
Potassium: 4.6 mmol/L (ref 3.5–5.2)
Sodium: 138 mmol/L (ref 134–144)
Total Protein: 7.2 g/dL (ref 6.0–8.5)

## 2019-01-25 LAB — AMYLASE: Amylase: 50 U/L (ref 31–110)

## 2019-01-26 DIAGNOSIS — K219 Gastro-esophageal reflux disease without esophagitis: Secondary | ICD-10-CM | POA: Diagnosis not present

## 2019-01-26 DIAGNOSIS — G7000 Myasthenia gravis without (acute) exacerbation: Secondary | ICD-10-CM | POA: Diagnosis not present

## 2019-01-26 DIAGNOSIS — I10 Essential (primary) hypertension: Secondary | ICD-10-CM | POA: Diagnosis not present

## 2019-02-08 ENCOUNTER — Ambulatory Visit: Payer: BLUE CROSS/BLUE SHIELD | Admitting: Neurology

## 2019-02-15 ENCOUNTER — Telehealth: Payer: Self-pay | Admitting: Neurology

## 2019-02-15 NOTE — Telephone Encounter (Signed)
I spoke with the patient. He stated the medicine is not helping. I advised per Dr. Jaynee Eagles, increase the Mycophenolate (cellcept) to 1000 mg (2 tablets) twice daily and then we will f/u at his appt Monday. Pt verbalized understanding and his questions were answered. He is aware that per Dr. Jaynee Eagles, labs will be needed next week so he preferred to come into the office for the visit rather than do telemedicine. He understands the check in process including the covid19 screening questions and mask requirement. He verbalized appreciation.  Appt note updated to reflect this is an in-office visit.

## 2019-02-15 NOTE — Telephone Encounter (Signed)
Pt states the medicine for his eye problem is not working well and he is wanting to know if there is something else that he can be put on. Please advise.

## 2019-02-21 ENCOUNTER — Ambulatory Visit: Payer: BLUE CROSS/BLUE SHIELD | Admitting: Neurology

## 2019-02-21 ENCOUNTER — Encounter: Payer: Self-pay | Admitting: Neurology

## 2019-02-21 ENCOUNTER — Other Ambulatory Visit: Payer: Self-pay

## 2019-02-21 VITALS — BP 173/93 | HR 65 | Temp 98.2°F | Ht 69.0 in | Wt 205.0 lb

## 2019-02-21 DIAGNOSIS — G7000 Myasthenia gravis without (acute) exacerbation: Secondary | ICD-10-CM

## 2019-02-21 DIAGNOSIS — G7001 Myasthenia gravis with (acute) exacerbation: Secondary | ICD-10-CM | POA: Diagnosis not present

## 2019-02-21 MED ORDER — MYCOPHENOLATE MOFETIL 500 MG PO TABS: 1000.0000 mg | ORAL_TABLET | Freq: Two times a day (BID) | ORAL | 4 refills | Status: DC

## 2019-02-21 MED ORDER — PREDNISONE 20 MG PO TABS: 60.0000 mg | ORAL_TABLET | Freq: Every day | ORAL | 1 refills | Status: DC

## 2019-02-21 NOTE — Patient Instructions (Signed)
Start prednisone. 60mg  daily, take in the morning with food watch for GI upset Stop Mestinon (pyridostigmine)   Prednisone tablets What is this medicine? PREDNISONE (PRED ni sone) is a corticosteroid. It is commonly used to treat inflammation of the skin, joints, lungs, and other organs. Common conditions treated include asthma, allergies, and arthritis. It is also used for other conditions, such as blood disorders and diseases of the adrenal glands. This medicine may be used for other purposes; ask your health care provider or pharmacist if you have questions. COMMON BRAND NAME(S): Deltasone, Predone, Sterapred, Sterapred DS What should I tell my health care provider before I take this medicine? They need to know if you have any of these conditions: -Cushing's syndrome -diabetes -glaucoma -heart disease -high blood pressure -infection (especially a virus infection such as chickenpox, cold sores, or herpes) -kidney disease -liver disease -mental illness -myasthenia gravis -osteoporosis -seizures -stomach or intestine problems -thyroid disease -an unusual or allergic reaction to lactose, prednisone, other medicines, foods, dyes, or preservatives -pregnant or trying to get pregnant -breast-feeding How should I use this medicine? Take this medicine by mouth with a glass of water. Follow the directions on the prescription label. Take this medicine with food. If you are taking this medicine once a day, take it in the morning. Do not take more medicine than you are told to take. Do not suddenly stop taking your medicine because you may develop a severe reaction. Your doctor will tell you how much medicine to take. If your doctor wants you to stop the medicine, the dose may be slowly lowered over time to avoid any side effects. Talk to your pediatrician regarding the use of this medicine in children. Special care may be needed. Overdosage: If you think you have taken too much of this medicine  contact a poison control center or emergency room at once. NOTE: This medicine is only for you. Do not share this medicine with others. What if I miss a dose? If you miss a dose, take it as soon as you can. If it is almost time for your next dose, talk to your doctor or health care professional. You may need to miss a dose or take an extra dose. Do not take double or extra doses without advice. What may interact with this medicine? Do not take this medicine with any of the following medications: -metyrapone -mifepristone This medicine may also interact with the following medications: -aminoglutethimide -amphotericin B -aspirin and aspirin-like medicines -barbiturates -certain medicines for diabetes, like glipizide or glyburide -cholestyramine -cholinesterase inhibitors -cyclosporine -digoxin -diuretics -ephedrine -male hormones, like estrogens and birth control pills -isoniazid -ketoconazole -NSAIDS, medicines for pain and inflammation, like ibuprofen or naproxen -phenytoin -rifampin -toxoids -vaccines -warfarin This list may not describe all possible interactions. Give your health care provider a list of all the medicines, herbs, non-prescription drugs, or dietary supplements you use. Also tell them if you smoke, drink alcohol, or use illegal drugs. Some items may interact with your medicine. What should I watch for while using this medicine? Visit your doctor or health care professional for regular checks on your progress. If you are taking this medicine over a prolonged period, carry an identification card with your name and address, the type and dose of your medicine, and your doctor's name and address. This medicine may increase your risk of getting an infection. Tell your doctor or health care professional if you are around anyone with measles or chickenpox, or if you develop sores or blisters that  do not heal properly. If you are going to have surgery, tell your doctor or  health care professional that you have taken this medicine within the last twelve months. Ask your doctor or health care professional about your diet. You may need to lower the amount of salt you eat. This medicine may increase blood sugar. Ask your healthcare provider if changes in diet or medicines are needed if you have diabetes. What side effects may I notice from receiving this medicine? Side effects that you should report to your doctor or health care professional as soon as possible: -allergic reactions like skin rash, itching or hives, swelling of the face, lips, or tongue -changes in emotions or moods -changes in vision -depressed mood -eye pain -fever or chills, cough, sore throat, pain or difficulty passing urine -signs and symptoms of high blood sugar such as being more thirsty or hungry or having to urinate more than normal. You may also feel very tired or have blurry vision. -swelling of ankles, feet Side effects that usually do not require medical attention (report to your doctor or health care professional if they continue or are bothersome): -confusion, excitement, restlessness -headache -nausea, vomiting -skin problems, acne, thin and shiny skin -trouble sleeping -weight gain This list may not describe all possible side effects. Call your doctor for medical advice about side effects. You may report side effects to FDA at 1-800-FDA-1088. Where should I keep my medicine? Keep out of the reach of children. Store at room temperature between 15 and 30 degrees C (59 and 86 degrees F). Protect from light. Keep container tightly closed. Throw away any unused medicine after the expiration date. NOTE: This sheet is a summary. It may not cover all possible information. If you have questions about this medicine, talk to your doctor, pharmacist, or health care provider.  2019 Elsevier/Gold Standard (2018-06-08 10:54:22)
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
## 2019-02-22 ENCOUNTER — Telehealth: Payer: Self-pay | Admitting: *Deleted

## 2019-02-22 ENCOUNTER — Telehealth: Payer: Self-pay | Admitting: Neurology

## 2019-02-22 ENCOUNTER — Encounter: Payer: Self-pay | Admitting: Neurology

## 2019-02-22 DIAGNOSIS — G7001 Myasthenia gravis with (acute) exacerbation: Secondary | ICD-10-CM | POA: Insufficient documentation

## 2019-02-22 LAB — COMPREHENSIVE METABOLIC PANEL
ALT: 22 IU/L (ref 0–44)
AST: 25 IU/L (ref 0–40)
Albumin/Globulin Ratio: 2.2 (ref 1.2–2.2)
Albumin: 4.7 g/dL (ref 3.8–4.9)
Alkaline Phosphatase: 84 IU/L (ref 39–117)
BUN/Creatinine Ratio: 12 (ref 9–20)
BUN: 10 mg/dL (ref 6–24)
Bilirubin Total: 0.5 mg/dL (ref 0.0–1.2)
CO2: 22 mmol/L (ref 20–29)
Calcium: 9.7 mg/dL (ref 8.7–10.2)
Chloride: 99 mmol/L (ref 96–106)
Creatinine, Ser: 0.83 mg/dL (ref 0.76–1.27)
GFR calc Af Amer: 113 mL/min/{1.73_m2} (ref >59)
GFR calc non Af Amer: 98 mL/min/{1.73_m2} (ref >59)
Globulin, Total: 2.1 g/dL (ref 1.5–4.5)
Glucose: 106 mg/dL — ABNORMAL HIGH (ref 65–99)
Potassium: 4.2 mmol/L (ref 3.5–5.2)
Sodium: 138 mmol/L (ref 134–144)
Total Protein: 6.8 g/dL (ref 6.0–8.5)

## 2019-02-22 LAB — CBC WITH DIFFERENTIAL/PLATELET
Basophils Absolute: 0.1 10*3/uL (ref 0.0–0.2)
Basos: 1 %
EOS (ABSOLUTE): 0.3 10*3/uL (ref 0.0–0.4)
Eos: 4 %
Hematocrit: 50.5 % (ref 37.5–51.0)
Hemoglobin: 17.1 g/dL (ref 13.0–17.7)
Immature Grans (Abs): 0 10*3/uL (ref 0.0–0.1)
Immature Granulocytes: 0 %
Lymphocytes Absolute: 2.3 10*3/uL (ref 0.7–3.1)
Lymphs: 31 %
MCH: 31 pg (ref 26.6–33.0)
MCHC: 33.9 g/dL (ref 31.5–35.7)
MCV: 92 fL (ref 79–97)
Monocytes Absolute: 0.5 10*3/uL (ref 0.1–0.9)
Monocytes: 7 %
Neutrophils Absolute: 4.3 10*3/uL (ref 1.4–7.0)
Neutrophils: 57 %
Platelets: 254 10*3/uL (ref 150–450)
RBC: 5.51 x10E6/uL (ref 4.14–5.80)
RDW: 13.5 % (ref 11.6–15.4)
WBC: 7.4 10*3/uL (ref 3.4–10.8)

## 2019-02-22 NOTE — Telephone Encounter (Signed)
Per Dr. Jaynee Eagles, start IVIG with loading dose of 2 g/kg over 3 days via IV followed by 1 g/kg over 1-2 days as tolerated by patient every 4 weeks via IV. Pre-treatment of tylenol PO 650 mg x1 as well as Benadryl (can be PO or IV or IM) 50 mg x 1.   I called Tiffany RN w/ [facility] and advised new orders pending for IVIG. Discussed our office will send office notes, labs, scan results, insurance information as they will be doing insurance authorization. All documents mentioned above printed and ready to be faxed to [employer]. Orders also written for IVIG and pre-meds as noted above. Fax number for [facility] is [phone]. Tiffany stated she will confirm receipt of fax.

## 2019-02-22 NOTE — Telephone Encounter (Signed)
IVIG orders and supporting documents faxed to [facility] @ [facility]. Received a receipt of confirmation from fax machine.

## 2019-02-22 NOTE — Telephone Encounter (Signed)
Received request from [facility] re: dx code for Mycophenolate. I provided the code G70.00 and faxed form back to [facility]. Received a receipt of confirmation.

## 2019-02-22 NOTE — Telephone Encounter (Signed)
Freda Munro ( Disability Claims Nurse) is calling in requesting a call back concerning some clinical question  CB# [phone]

## 2019-02-23 NOTE — Telephone Encounter (Signed)
Tiffany from [facility] called and confirmed receipt of IVIG orders.

## 2019-02-23 NOTE — Telephone Encounter (Signed)
Spoke with Freda Munro. She wanted to confirm pt's office dates. I advised of all 2020 OVs on 4/2, 4/20, 5/4, 6/1. She asked for status update. I advised pt's symptoms worsened. We are adding new medication. Return to work estimated 03/24/2019, 12 weeks total. She had no further questions.

## 2019-02-23 NOTE — Telephone Encounter (Signed)
Returned [employer] call and LVM asking for call back. Left office number and hours in message.

## 2019-02-24 NOTE — Telephone Encounter (Signed)
Received IVIG orders for Dr. Jaynee Eagles to sign.   IVIG 180 grams IV in divided doses over 3 days ONE TIME followed by maintenance dose of 90 grams IV in divided doses over 1-2 days every 4 weeks. Pre-medication is Tylenol 650 mg PO 30 minutes prior to infusion. Also Benadryl 25 mg tablet or capsule 30 minutes prior to infusion. Other orders included Anaphylaxis kit PRN (Epinephrine, Benadryl and 0.9% NaCl), nursing orders for education and administration and IV flushes before and after medication.   Orders signed by Dr. Jaynee Eagles and returned via fax to [facility]. Received a receipt of confirmation.

## 2019-03-02 NOTE — Telephone Encounter (Signed)
Per Tiffany @ [facility], IVIG has been approved. They will ship out medication as soon as patient can provide a start date.

## 2019-03-14 ENCOUNTER — Telehealth: Payer: Self-pay | Admitting: Neurology

## 2019-03-14 NOTE — Telephone Encounter (Signed)
Pt's return to work date has been estimated for 03/24/2019 and was confirmed with disability case manager on 02/23/2019. I will send to Dr. Jaynee Eagles to confirm no changes have been made.

## 2019-03-14 NOTE — Telephone Encounter (Signed)
Pt daughter has called to inform RN Romelle Starcher that there is an additional form that needs to be completed this week if [name] is going to keep pt out of work.  The daughter is faxing in the form,

## 2019-03-14 NOTE — Telephone Encounter (Signed)
I am going to keep him out of work another 6 months, he still has significant ocular disease and needs IVIG

## 2019-03-16 NOTE — Telephone Encounter (Signed)
I have not receive any paperwork on this patient.

## 2019-03-16 NOTE — Telephone Encounter (Signed)
I called Margaret back. She stated the forms had been faxed to the main fax number on Monday. She will fax again, this time to [phone]. She understands the office will reach out for payment. Also aware Dr. Jaynee Eagles is going to keep patient out of work for another 6 months. She verbalized appreciation for the call.

## 2019-03-17 ENCOUNTER — Telehealth: Payer: Self-pay | Admitting: *Deleted

## 2019-03-17 ENCOUNTER — Encounter: Payer: Self-pay | Admitting: *Deleted

## 2019-03-17 NOTE — Telephone Encounter (Signed)
I spoke with the patient after speaking with Dr. Jaynee Eagles, and I offered to r/s him 4 weeks out if he is doing better on the steroids and IVIG. Pt stated his eyes were much better however he still has a few questions for Dr. Jaynee Eagles. He also stated he had not started the IVIG yet because he thought Dr. Jaynee Eagles would want to see how he is doing on the steroids before starting IVIG. He understands Dr. Jaynee Eagles is taking him out of work another 6 months. He needs a letter for work to give them in addition to the disability paperwork that will be needed. I advised we had not received the forms that his daughter said she would fax to [place]. Patient will come in for his appointment Monday to discuss. He passed the [place] screening questions and will bring his mask. He verbalized appreciation for the call.

## 2019-03-17 NOTE — Telephone Encounter (Signed)
Opened in error

## 2019-03-21 ENCOUNTER — Ambulatory Visit (INDEPENDENT_AMBULATORY_CARE_PROVIDER_SITE_OTHER): Payer: BC Managed Care – PPO | Admitting: Neurology

## 2019-03-21 ENCOUNTER — Encounter: Payer: Self-pay | Admitting: Neurology

## 2019-03-21 ENCOUNTER — Other Ambulatory Visit: Payer: Self-pay

## 2019-03-21 ENCOUNTER — Encounter: Payer: Self-pay | Admitting: *Deleted

## 2019-03-21 VITALS — BP 168/89 | HR 63 | Temp 98.4°F | Ht 69.0 in | Wt 205.0 lb

## 2019-03-21 DIAGNOSIS — G7000 Myasthenia gravis without (acute) exacerbation: Secondary | ICD-10-CM

## 2019-03-21 DIAGNOSIS — Z79899 Other long term (current) drug therapy: Secondary | ICD-10-CM | POA: Diagnosis not present
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
## 2019-03-21 NOTE — Patient Instructions (Addendum)
Decrease prednisone by 5mg  every 2 weeks Week 1: 55mg  (5.5 tabs) Week 2: 50mg  (5 tabs) Week 3: 45mg  (4.5 tabes) Week 4: 40mg  (4 tabs)  Follow up 6 weeks

## 2019-03-22 ENCOUNTER — Telehealth: Payer: Self-pay | Admitting: *Deleted

## 2019-03-22 ENCOUNTER — Telehealth: Payer: Self-pay | Admitting: Neurology

## 2019-03-22 LAB — COMPREHENSIVE METABOLIC PANEL
ALT: 23 IU/L (ref 0–44)
AST: 19 IU/L (ref 0–40)
Albumin/Globulin Ratio: 2.3 — ABNORMAL HIGH (ref 1.2–2.2)
Albumin: 4.6 g/dL (ref 3.8–4.9)
Alkaline Phosphatase: 72 IU/L (ref 39–117)
BUN/Creatinine Ratio: 19 (ref 9–20)
BUN: 17 mg/dL (ref 6–24)
Bilirubin Total: 0.4 mg/dL (ref 0.0–1.2)
CO2: 20 mmol/L (ref 20–29)
Calcium: 9.5 mg/dL (ref 8.7–10.2)
Chloride: 96 mmol/L (ref 96–106)
Creatinine, Ser: 0.89 mg/dL (ref 0.76–1.27)
GFR calc Af Amer: 110 mL/min/{1.73_m2} (ref >59)
GFR calc non Af Amer: 95 mL/min/{1.73_m2} (ref >59)
Globulin, Total: 2 g/dL (ref 1.5–4.5)
Glucose: 137 mg/dL — ABNORMAL HIGH (ref 65–99)
Potassium: 4.4 mmol/L (ref 3.5–5.2)
Sodium: 138 mmol/L (ref 134–144)
Total Protein: 6.6 g/dL (ref 6.0–8.5)

## 2019-03-22 LAB — CBC WITH DIFFERENTIAL/PLATELET
Basophils Absolute: 0 10*3/uL (ref 0.0–0.2)
Basos: 0 %
EOS (ABSOLUTE): 0.2 10*3/uL (ref 0.0–0.4)
Eos: 2 %
Hematocrit: 47.5 % (ref 37.5–51.0)
Hemoglobin: 16.7 g/dL (ref 13.0–17.7)
Immature Grans (Abs): 0 10*3/uL (ref 0.0–0.1)
Immature Granulocytes: 1 %
Lymphocytes Absolute: 3.2 10*3/uL — ABNORMAL HIGH (ref 0.7–3.1)
Lymphs: 36 %
MCH: 32.1 pg (ref 26.6–33.0)
MCHC: 35.2 g/dL (ref 31.5–35.7)
MCV: 91 fL (ref 79–97)
Monocytes Absolute: 0.6 10*3/uL (ref 0.1–0.9)
Monocytes: 7 %
Neutrophils Absolute: 4.9 10*3/uL (ref 1.4–7.0)
Neutrophils: 54 %
Platelets: 229 10*3/uL (ref 150–450)
RBC: 5.2 x10E6/uL (ref 4.14–5.80)
RDW: 13.6 % (ref 11.6–15.4)
WBC: 8.9 10*3/uL (ref 3.4–10.8)

## 2019-03-22 LAB — HEMOGLOBIN A1C
Est. average glucose Bld gHb Est-mCnc: 151 mg/dL
Hgb A1c MFr Bld: 6.9 % — ABNORMAL HIGH (ref 4.8–5.6)

## 2019-03-22 NOTE — Telephone Encounter (Signed)
Received short term disability form from pt. Form completed and sent to medical records for processing. Dr. Jaynee Eagles keeping patient out of work for the next 6 months. Return to work est. 09/26/2019.  Form received today. Due on 03/23/2019.

## 2019-03-22 NOTE — Telephone Encounter (Signed)
Would you call patient and let him know the following please:  Mr. Yim, your HgbA1c was elevated. Prior to starting the steroids, you were pre-diabetic. Now you are in the mild diabetic range with an average glucose of 151 daily. If you can get in to see a primary care soon to treat that would be great otherwise we will have to taper you off of the steroids more quickly and watch you very carefully.I can also call a colleague in internal medicine and see what they recommend to treat the elevated glucose (let me know if you get an appointment within the next 4 weeks with a primary care). If you have any unusual symptoms go to the ED (Nausea, vomiting, abdominal pain, fatigue, dry mouth, excessive urination). We will check labs again at next appointment. Continue to taper the steroids at the schedule we discussed (decrease by 5mg  or 1/2 a pill every 2 weeks). Thanks.

## 2019-03-22 NOTE — Telephone Encounter (Signed)
Pt [employer] form fax on 03/22/19.

## 2019-03-23 NOTE — Telephone Encounter (Signed)
I spoke with the patient and reviewed Dr. Cathren Laine message with pt about his Hgba1c, when to go to ED and that she advises pt see PCP. He has an appt on 8/18 with PCP and he will keep calling to see if he can be seen sooner. He will call our office back Monday if he hasn't gotten a sooner appointment. He verbalized understanding of the message and the plan. He will continue decrease his steroids every 2 weeks per plan.

## 2019-04-14 ENCOUNTER — Other Ambulatory Visit: Payer: Self-pay | Admitting: Neurology

## 2019-04-14 DIAGNOSIS — G7000 Myasthenia gravis without (acute) exacerbation: Secondary | ICD-10-CM

## 2019-04-14 MED ORDER — PREDNISONE 10 MG PO TABS: ORAL_TABLET | ORAL | 3 refills | Status: DC

## 2019-04-21 ENCOUNTER — Telehealth: Payer: Self-pay | Admitting: General Practice

## 2019-04-21 DIAGNOSIS — G7001 Myasthenia gravis with (acute) exacerbation: Secondary | ICD-10-CM | POA: Diagnosis not present

## 2019-04-21 NOTE — Telephone Encounter (Signed)
Patient has already been rescheduled for a sooner appt per wife.

## 2019-04-22 ENCOUNTER — Other Ambulatory Visit: Payer: Self-pay

## 2019-04-22 ENCOUNTER — Telehealth: Payer: Self-pay | Admitting: General Practice

## 2019-04-25 ENCOUNTER — Ambulatory Visit: Payer: BC Managed Care – PPO | Admitting: Family Medicine

## 2019-04-25 ENCOUNTER — Encounter: Payer: Self-pay | Admitting: Family Medicine

## 2019-04-25 ENCOUNTER — Other Ambulatory Visit: Payer: Self-pay

## 2019-04-25 VITALS — BP 143/76 | HR 76 | Temp 99.2°F | Ht 69.0 in | Wt 189.0 lb

## 2019-04-25 DIAGNOSIS — T380X5A Adverse effect of glucocorticoids and synthetic analogues, initial encounter: Secondary | ICD-10-CM

## 2019-04-25 DIAGNOSIS — E099 Drug or chemical induced diabetes mellitus without complications: Secondary | ICD-10-CM | POA: Diagnosis not present

## 2019-04-25 DIAGNOSIS — Z7689 Persons encountering health services in other specified circumstances: Secondary | ICD-10-CM | POA: Diagnosis not present

## 2019-04-25 DIAGNOSIS — Z720 Tobacco use: Secondary | ICD-10-CM

## 2019-04-25 DIAGNOSIS — I10 Essential (primary) hypertension: Secondary | ICD-10-CM

## 2019-04-25 DIAGNOSIS — G7000 Myasthenia gravis without (acute) exacerbation: Secondary | ICD-10-CM | POA: Insufficient documentation

## 2019-04-25 DIAGNOSIS — R252 Cramp and spasm: Secondary | ICD-10-CM

## 2019-04-25 LAB — BAYER DCA HB A1C WAIVED: HB A1C (BAYER DCA - WAIVED): 10.3 % — ABNORMAL HIGH (ref <7.0)

## 2019-04-25 MED ORDER — METFORMIN HCL 500 MG PO TABS: ORAL_TABLET | ORAL | 0 refills | Status: DC

## 2019-04-25 NOTE — Progress Notes (Signed)
Subjective: NO:BSJGGEZMO care, elevated BGs HPI: Kenneth Fields is a 58 y.o. male presenting to clinic today for:  1. Elevated Blood sugar Patient notes that he has had elevated blood sugars at home anywhere between 200 and 400s.  His most recent blood sugar this morning was in the 300s.  He discussed this with his neurologist and they recommended that he establish care with a primary care doctor ASAP as this was likely drug-induced diabetes.  He notes that he was started on prednisone about a month ago and initially was started at high dose but has since tapered down to 20 mg daily.  There is plans to transition over to a infusion for his myasthenia but this has not yet been started.  He does report polydipsia, polyuria, lower extremity cramping and occasional blurry vision.  He was seen by an eye doctor earlier this year.  He was diagnosed with myasthenia gravis about 4 months ago.  His initial symptoms were droopy eyelid, double vision. No known family hx of MG.  He does report weight loss that is unintended.  He started at about 205 and is now down to 189.  2.  Tobacco use disorder Patient reports 40+ year history of 1 pack/day smoking.  He notes he tried Chantix but could not tolerated secondary to change in his mental health and mood.  He is tried patches before but unfortunately use these before bed and was having nightmares from them.  He is considered retrying them but is not ready to do this yet.  Denies shortness of breath, chest pain or hemoptysis.  He does report unplanned weight loss as above since blood sugars have been elevated.  Past Medical History:  Diagnosis Date  . High cholesterol   . Hypertension   . Myasthenia gravis [facility])    Past Surgical History:  Procedure Laterality Date  . ELBOW SURGERY Right    for tendonitis  . TYMPANOSTOMY TUBE PLACEMENT Bilateral    as a child   . WISDOM TOOTH EXTRACTION     Social History   Socioeconomic History  . Marital status:  Married    Spouse name: Not on file  . Number of children: Not on file  . Years of education: Not on file  . Highest education level: 11th grade  Occupational History  . Not on file  Social Needs  . Financial resource strain: Not on file  . Food insecurity    Worry: Not on file    Inability: Not on file  . Transportation needs    Medical: Not on file    Non-medical: Not on file  Tobacco Use  . Smoking status: Current Every Day Smoker    Packs/day: 1.00    Years: 1.00    Pack years: 1.00    Types: Cigarettes  . Smokeless tobacco: Never Used  Substance and Sexual Activity  . Alcohol use: Yes    Alcohol/week: 14.0 - 21.0 standard drinks    Types: 14 - 21 Cans of beer per week  . Drug use: Never  . Sexual activity: Not on file  Lifestyle  . Physical activity    Days per week: Not on file    Minutes per session: Not on file  . Stress: Not on file  Relationships  . Social Herbalist on phone: Not on file    Gets together: Not on file    Attends religious service: Not on file    Active member of club  or organization: Not on file    Attends meetings of clubs or organizations: Not on file    Relationship status: Not on file  . Intimate partner violence    Fear of current or ex partner: Not on file    Emotionally abused: Not on file    Physically abused: Not on file    Forced sexual activity: Not on file  Other Topics Concern  . Not on file  Social History Narrative   Live at home with his wife   Right handed   Caffeine: 1 diet mtn dew daily, occasional tea    Current Meds  Medication Sig  . aspirin EC 81 MG tablet Take 81 mg by mouth daily.  . benazepril (LOTENSIN) 20 MG tablet Take 20 mg by mouth daily.  . cetirizine (ZYRTEC) 10 MG tablet Take 10 mg by mouth daily as needed for allergies.  Marland Kitchen diltiazem (CARDIZEM CD) 120 MG 24 hr capsule Take 120 mg by mouth daily.  . mycophenolate (CELLCEPT) 500 MG tablet Take 2 tablets (1,000 mg total) by mouth 2 (two)  times daily. May further increase at patient's next appointment  . predniSONE (DELTASONE) 10 MG tablet Decrease prednisone by 77m every 2 weeks: 510m(5.5 tabs) daily, 5025m5 tabs)daily, 21m50m.5 tabs) daily, 40mg41mtabs) daily with food (Patient taking differently: Take 20 mg by mouth daily with breakfast. )  . pyridostigmine (MESTINON) 60 MG tablet Take 120 mg by mouth. Three times daily (6 AM, lunch, 6 PM).  . rosuvastatin (CRESTOR) 10 MG tablet Take 10 mg by mouth daily.   Family History  Problem Relation Age of Onset  . Lung cancer Mother   . Cancer Mother    Not on File  ROS: Per HPI  Objective: Office vital signs reviewed. BP (!) 143/76   Pulse 76   Temp 99.2 F (37.3 C) (Temporal)   Ht _0  (1.753 m)   Wt 189 lb (85.7 kg)   BMI 27.91 kg/m   Physical Examination:  General: Awake, alert, well nourished, No acute distress HEENT: Normal, sclera white. MMM Cardio: regular rate and rhythm, S1S2 heard, no murmurs appreciated Pulm: clear to auscultation bilaterally, no wheezes, rhonchi or rales; normal work of breathing on room air Extremities: warm, well perfused, No edema, cyanosis or clubbing; +2 pulses bilaterally MSK: normal gait and station Skin: dry; intact; no rashes or lesions Neuro: no facial droop. No focal neurologic deficits.  Assessment/ Plan: 57 y.66 male   1. 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  Expect that this is probably gone to be elevated given fasting blood sugars well into the 2 and 300s at home.  Start metformin.  May be able to come off of this medication once he is discontinued the prednisone.  He will follow-up in 1 month with fasting blood sugar log.  If needed we will plan to increase dose to 1000 mg twice daily at that time.  I suspect that his unplanned weight loss is secondary to catabolic state but if he has persistent weight loss despite controlled blood sugar low threshold to obtain CT of the chest  given ongoing smoking. - Bayer DCA Hb A1c Waived - CMP14+EGFR - metFORMIN (GLUCOPHAGE) 500 MG tablet; Take 1 tablet (500 mg total) by mouth daily with breakfast for 14 days, THEN 1 tablet (500 mg total) 2 (two) times daily with a meal.  Dispense: 180 tablet; Refill: 0  2. Establishing care with new doctor, encounter  for I reviewed his laboratory results in the database.  He has had prediabetes for quite some time now.   3. Myasthenia gravis ([place]) Followed by Dr. Lavell Anchors.  Plan to transition over infusion from prednisone soon.  4. Leg cramping Likely secondary to fluid loss from polyuria due to diabetes.  Check metabolic panel and magnesium - CMP14+EGFR - Magnesium  5. Essential hypertension with goal blood pressure less than 130/80 Not at goal but may be situational anxiety secondary to establishing care.  Will recheck in 1 month and if persistently elevated plan to augment his medication regimen.  Could consider increasing benazepril versus Cardizem.  6. Tobacco use Contemplative.  Failed Chantix secondary to side effects.  We discussed consideration for revisiting patches.  He will consider this  [name], [place] [phone]

## 2019-04-25 NOTE — Patient Instructions (Signed)
You had labs performed today.  You will be contacted with the results of the labs once they are available, usually in the next 3 business days for routine lab work.  If you have an active my chart account, they will be released to your MyChart.  If you prefer to have these labs released to you via telephone, please let us know.  If you had a pap smear or biopsy performed, expect to be contacted in about 7-10 days.   Diabetes Mellitus and Nutrition, Adult When you have diabetes (diabetes mellitus), it is very important to have healthy eating habits because your blood sugar (glucose) levels are greatly affected by what you eat and drink. Eating healthy foods in the appropriate amounts, at about the same times every day, can help you:  Control your blood glucose.  Lower your risk of heart disease.  Improve your blood pressure.  Reach or maintain a healthy weight. Every person with diabetes is different, and each person has different needs for a meal plan. Your health care provider may recommend that you work with a diet and nutrition specialist (dietitian) to make a meal plan that is best for you. Your meal plan may vary depending on factors such as:  The calories you need.  The medicines you take.  Your weight.  Your blood glucose, blood pressure, and cholesterol levels.  Your activity level.  Other health conditions you have, such as heart or kidney disease. How do carbohydrates affect me? Carbohydrates, also called carbs, affect your blood glucose level more than any other type of food. Eating carbs naturally raises the amount of glucose in your blood. Carb counting is a method for keeping track of how many carbs you eat. Counting carbs is important to keep your blood glucose at a healthy level, especially if you use insulin or take certain oral diabetes medicines. It is important to know how many carbs you can safely have in each meal. This is different for every person. Your dietitian  can help you calculate how many carbs you should have at each meal and for each snack. Foods that contain carbs include:  Bread, cereal, rice, pasta, and crackers.  Potatoes and corn.  Peas, beans, and lentils.  Milk and yogurt.  Fruit and juice.  Desserts, such as cakes, cookies, ice cream, and candy. How does alcohol affect me? Alcohol can cause a sudden decrease in blood glucose (hypoglycemia), especially if you use insulin or take certain oral diabetes medicines. Hypoglycemia can be a life-threatening condition. Symptoms of hypoglycemia (sleepiness, dizziness, and confusion) are similar to symptoms of having too much alcohol. If your health care provider says that alcohol is safe for you, follow these guidelines:  Limit alcohol intake to no more than 1 drink per day for nonpregnant women and 2 drinks per day for men. One drink equals 12 oz of beer, 5 oz of wine, or 1 oz of hard liquor.  Do not drink on an empty stomach.  Keep yourself hydrated with water, diet soda, or unsweetened iced tea.  Keep in mind that regular soda, juice, and other mixers may contain a lot of sugar and must be counted as carbs. What are tips for following this plan?  Reading food labels  Start by checking the serving size on the "Nutrition Facts" label of packaged foods and drinks. The amount of calories, carbs, fats, and other nutrients listed on the label is based on one serving of the item. Many items contain more than one  serving per package.  Check the total grams (g) of carbs in one serving. You can calculate the number of servings of carbs in one serving by dividing the total carbs by 15. For example, if a food has 30 g of total carbs, it would be equal to 2 servings of carbs.  Check the number of grams (g) of saturated and trans fats in one serving. Choose foods that have low or no amount of these fats.  Check the number of milligrams (mg) of salt (sodium) in one serving. Most people should  limit total sodium intake to less than 2,300 mg per day.  Always check the nutrition information of foods labeled as "low-fat" or "nonfat". These foods may be higher in added sugar or refined carbs and should be avoided.  Talk to your dietitian to identify your daily goals for nutrients listed on the label. Shopping  Avoid buying canned, premade, or processed foods. These foods tend to be high in fat, sodium, and added sugar.  Shop around the outside edge of the grocery store. This includes fresh fruits and vegetables, bulk grains, fresh meats, and fresh dairy. Cooking  Use low-heat cooking methods, such as baking, instead of high-heat cooking methods like deep frying.  Cook using healthy oils, such as olive, canola, or sunflower oil.  Avoid cooking with butter, cream, or high-fat meats. Meal planning  Eat meals and snacks regularly, preferably at the same times every day. Avoid going long periods of time without eating.  Eat foods high in fiber, such as fresh fruits, vegetables, beans, and whole grains. Talk to your dietitian about how many servings of carbs you can eat at each meal.  Eat 4-6 ounces (oz) of lean protein each day, such as lean meat, chicken, fish, eggs, or tofu. One oz of lean protein is equal to: ? 1 oz of meat, chicken, or fish. ? 1 egg. ?  cup of tofu.  Eat some foods each day that contain healthy fats, such as avocado, nuts, seeds, and fish. Lifestyle  Check your blood glucose regularly.  Exercise regularly as told by your health care provider. This may include: ? 150 minutes of moderate-intensity or vigorous-intensity exercise each week. This could be brisk walking, biking, or water aerobics. ? Stretching and doing strength exercises, such as yoga or weightlifting, at least 2 times a week.  Take medicines as told by your health care provider.  Do not use any products that contain nicotine or tobacco, such as cigarettes and e-cigarettes. If you need help  quitting, ask your health care provider.  Work with a Social worker or diabetes educator to identify strategies to manage stress and any emotional and social challenges. Questions to ask a health care provider  Do I need to meet with a diabetes educator?  Do I need to meet with a dietitian?  What number can I call if I have questions?  When are the best times to check my blood glucose? Where to find more information:  American Diabetes Association: diabetes.org  Academy of Nutrition and Dietetics: www.eatright.[employer] of Diabetes and Digestive and Kidney Diseases (NIH): [employer] Summary  A healthy meal plan will help you control your blood glucose and maintain a healthy lifestyle.  Working with a diet and nutrition specialist (dietitian) can help you make a meal plan that is best for you.  Keep in mind that carbohydrates (carbs) and alcohol have immediate effects on your blood glucose levels. It is important to count carbs and to  use alcohol carefully. This information is not intended to replace advice given to you by your health care provider. Make sure you discuss any questions you have with your health care provider. Document Released: 06/05/2005 Document Revised: 08/21/2017 Document Reviewed: 10/13/2016 Elsevier Patient Education  2020 [employer].

## 2019-04-26 LAB — CMP14+EGFR
ALT: 27 IU/L (ref 0–44)
AST: 26 IU/L (ref 0–40)
Albumin/Globulin Ratio: 2.3 — ABNORMAL HIGH (ref 1.2–2.2)
Albumin: 4.6 g/dL (ref 3.8–4.9)
Alkaline Phosphatase: 79 IU/L (ref 39–117)
BUN/Creatinine Ratio: 26 — ABNORMAL HIGH (ref 9–20)
BUN: 25 mg/dL — ABNORMAL HIGH (ref 6–24)
Bilirubin Total: 0.4 mg/dL (ref 0.0–1.2)
CO2: 16 mmol/L — ABNORMAL LOW (ref 20–29)
Calcium: 9.8 mg/dL (ref 8.7–10.2)
Chloride: 93 mmol/L — ABNORMAL LOW (ref 96–106)
Creatinine, Ser: 0.97 mg/dL (ref 0.76–1.27)
GFR calc Af Amer: 100 mL/min/{1.73_m2} (ref >59)
GFR calc non Af Amer: 86 mL/min/{1.73_m2} (ref >59)
Globulin, Total: 2 g/dL (ref 1.5–4.5)
Glucose: 442 mg/dL (ref 65–99)
Potassium: 4.8 mmol/L (ref 3.5–5.2)
Sodium: 131 mmol/L — ABNORMAL LOW (ref 134–144)
Total Protein: 6.6 g/dL (ref 6.0–8.5)

## 2019-04-26 LAB — MAGNESIUM: Magnesium: 2 mg/dL (ref 1.6–2.3)

## 2019-04-28 DIAGNOSIS — G7001 Myasthenia gravis with (acute) exacerbation: Secondary | ICD-10-CM | POA: Diagnosis not present

## 2019-04-29 DIAGNOSIS — G7001 Myasthenia gravis with (acute) exacerbation: Secondary | ICD-10-CM | POA: Diagnosis not present

## 2019-04-30 DIAGNOSIS — G7001 Myasthenia gravis with (acute) exacerbation: Secondary | ICD-10-CM | POA: Diagnosis not present

## 2019-05-02 ENCOUNTER — Telehealth: Payer: BC Managed Care – PPO | Admitting: Neurology

## 2019-05-03 ENCOUNTER — Other Ambulatory Visit: Payer: Self-pay

## 2019-05-03 ENCOUNTER — Encounter: Payer: Self-pay | Admitting: Neurology

## 2019-05-03 ENCOUNTER — Ambulatory Visit (INDEPENDENT_AMBULATORY_CARE_PROVIDER_SITE_OTHER): Payer: BC Managed Care – PPO | Admitting: Neurology

## 2019-05-03 VITALS — BP 149/90 | HR 64 | Temp 98.4°F | Ht 68.0 in | Wt 189.0 lb

## 2019-05-03 DIAGNOSIS — G7001 Myasthenia gravis with (acute) exacerbation: Secondary | ICD-10-CM | POA: Diagnosis not present

## 2019-05-03 NOTE — Patient Instructions (Addendum)
-15mg  for 3 days, 10mg  for 3 days, 5mg  for 3 days and then stop. Discussed steroid withdrawal, for any symptoms please call the office asap and go back to the last dose of steroids. -IVIG in 4 weeks. If doing well extend to 5 weeks. If doing well then extend next treatment to 6 weeks. Then stop. -Recommend physical therapy    Myasthenia Gravis Myasthenia gravis (MG) is a long-term (chronic) condition that causes weakness in the muscles you can control (voluntary muscles). MG can affect any voluntary muscle. The muscles most often affected are the ones that control:  Eye movement.  Facial movements.  Swallowing. MG is a disease in which the body's disease-fighting system (immune system) attacks its own healthy tissues (autoimmune disease). When you have MG, your immune system makes proteins (antibodies) that block the chemical (acetylcholine) that your body needs to send nerve signals to your muscles. This causes muscle weakness. What are the causes? The exact cause of MG is not known. What increases the risk? The following factors may make you more likely to develop this condition:  Having an enlarged thymus gland. The thymus gland is located under the breastbone. It makes certain cells for the immune system.  Having a family history of MG. What are the signs or symptoms? Symptoms of MG may include:  Drooping eyelids.  Double vision.  Muscle weakness that gets worse with activity and gets better after rest.  Difficulty walking.  Trouble chewing and swallowing.  Trouble making facial expressions.  Slurred speech.  Weakness of the arms, hands, and legs. Sudden, severe difficulty breathing (myasthenic crisis) may develop after having:  An infection.  A fever.  A bad reaction to a medicine. Myasthenic crisis requires emergency breathing support. Sometimes symptoms of MG go away for a while (remission) and then come back later. How is this diagnosed? This condition may be  diagnosed based on:  Your symptoms and medical history.  A physical exam.  Blood tests.  Tests of your muscle strength and function.  Imaging tests, such as a CT scan or an MRI. How is this treated? The goal of treatment is to improve muscle strength. Treatment may include:  Taking medicine.  Making lifestyle changes that focus on saving your energy.  Doing physical therapy to gain strength.  Having surgery to remove the thymus gland (thymectomy). This may result in a long remission for some people.  Having a procedure to remove the acetylcholine antibodies (plasmapheresis).  Getting emergency breathing support, if you experience myasthenic crisis. If you experience remission, you may be able to stop treatment and then resume treatment when your symptoms return. Follow these instructions at home:   Take over-the-counter and prescription medicines only as told by your health care provider.  Get plenty of rest and sleep. Take frequent breaks to rest your eyes, especially when in bright light or working on a computer.  Maintain a healthy diet and a healthy weight. Work with your health care provider or a diet and nutrition specialist (dietitian) if you need help.  Do exercises as told by your health care provider or physical therapist.  Do not use any products that contain nicotine or tobacco, such as cigarettes and e-cigarettes. If you need help quitting, ask your health care provider.  Prevent infections by: ? Washing your hands often with soap and water. If soap and water are not available, use hand sanitizer. ? Avoiding contact with other people who are sick. ? Avoiding touching your eyes, nose, and mouth. ?  Cleaning surfaces in your home that are touched often using a disinfectant.  Keep all follow-up visits as told by your health care provider. This is important. Contact a health care provider if:  Your symptoms change or get worse, especially after having a fever  or infection. Get help right away if:  You have trouble breathing. Summary  Myasthenia gravis (MG) is a long-term (chronic) condition that causes weakness in the muscles you can control (voluntary muscles).  A symptom of MG is muscle weakness that gets worse with activity and gets better after rest.  Sudden, severe difficulty breathing (myasthenic crisis) may develop after having an infection, a fever, or a bad reaction to a medicine.  The goal of treatment is to improve muscle strength. Treatment may include medicines, lifestyle changes, physical therapy, surgery, plasmapheresis, or emergency breathing support. This information is not intended to replace advice given to you by your health care provider. Make sure you discuss any questions you have with your health care provider. Document Released: 12/15/2000 Document Revised: 09/21/2017 Document Reviewed: 09/21/2017 Else[employer] Patient Education  [employer].   Prednisone tablets What is this medicine? PREDNISONE (PRED ni sone) is a corticosteroid. It is commonly used to treat inflammation of the skin, joints, lungs, and other organs. Common conditions treated include asthma, allergies, and arthritis. It is also used for other conditions, such as blood disorders and diseases of the adrenal glands. This medicine may be used for other purposes; ask your health care provider or pharmacist if you have questions. COMMON BRAND NAME(S): Deltasone, Predone, Sterapred, Sterapred DS What should I tell my health care provider before I take this medicine? They need to know if you have any of these conditions:  Cushing's syndrome  diabetes  glaucoma  heart disease  high blood pressure  infection (especially a virus infection such as chickenpox, cold sores, or herpes)  kidney disease  liver disease  mental illness  myasthenia gravis  osteoporosis  seizures  stomach or intestine problems  thyroid disease  an unusual or  allergic reaction to lactose, prednisone, other medicines, foods, dyes, or preservatives  pregnant or trying to get pregnant  breast-feeding How should I use this medicine? Take this medicine by mouth with a glass of water. Follow the directions on the prescription label. Take this medicine with food. If you are taking this medicine once a day, take it in the morning. Do not take more medicine than you are told to take. Do not suddenly stop taking your medicine because you may develop a severe reaction. Your doctor will tell you how much medicine to take. If your doctor wants you to stop the medicine, the dose may be slowly lowered over time to avoid any side effects. Talk to your pediatrician regarding the use of this medicine in children. Special care may be needed. Overdosage: If you think you have taken too much of this medicine contact a poison control center or emergency room at once. NOTE: This medicine is only for you. Do not share this medicine with others. What if I miss a dose? If you miss a dose, take it as soon as you can. If it is almost time for your next dose, talk to your doctor or health care professional. You may need to miss a dose or take an extra dose. Do not take double or extra doses without advice. What may interact with this medicine? Do not take this medicine with any of the following medications:  metyrapone  mifepristone This  medicine may also interact with the following medications:  aminoglutethimide  amphotericin B  aspirin and aspirin-like medicines  barbiturates  certain medicines for diabetes, like glipizide or glyburide  cholestyramine  cholinesterase inhibitors  cyclosporine  digoxin  diuretics  ephedrine  male hormones, like estrogens and birth control pills  isoniazid  ketoconazole  NSAIDS, medicines for pain and inflammation, like ibuprofen or naproxen  phenytoin  rifampin  toxoids  vaccines  warfarin This list may  not describe all possible interactions. Give your health care provider a list of all the medicines, herbs, non-prescription drugs, or dietary supplements you use. Also tell them if you smoke, drink alcohol, or use illegal drugs. Some items may interact with your medicine. What should I watch for while using this medicine? Visit your doctor or health care professional for regular checks on your progress. If you are taking this medicine over a prolonged period, carry an identification card with your name and address, the type and dose of your medicine, and your doctor's name and address. This medicine may increase your risk of getting an infection. Tell your doctor or health care professional if you are around anyone with measles or chickenpox, or if you develop sores or blisters that do not heal properly. If you are going to have surgery, tell your doctor or health care professional that you have taken this medicine within the last twelve months. Ask your doctor or health care professional about your diet. You may need to lower the amount of salt you eat. This medicine may increase blood sugar. Ask your healthcare provider if changes in diet or medicines are needed if you have diabetes. What side effects may I notice from receiving this medicine? Side effects that you should report to your doctor or health care professional as soon as possible:  allergic reactions like skin rash, itching or hives, swelling of the face, lips, or tongue  changes in emotions or moods  changes in vision  depressed mood  eye pain  fever or chills, cough, sore throat, pain or difficulty passing urine  signs and symptoms of high blood sugar such as being more thirsty or hungry or having to urinate more than normal. You may also feel very tired or have blurry vision.  swelling of ankles, feet Side effects that usually do not require medical attention (report to your doctor or health care professional if they continue  or are bothersome):  confusion, excitement, restlessness  headache  nausea, vomiting  skin problems, acne, thin and shiny skin  trouble sleeping  weight gain This list may not describe all possible side effects. Call your doctor for medical advice about side effects. You may report side effects to FDA at 1-800-FDA-1088. Where should I keep my medicine? Keep out of the reach of children. Store at room temperature between 15 and 30 degrees C (59 and 86 degrees F). Protect from light. Keep container tightly closed. Throw away any unused medicine after the expiration date. NOTE: This sheet is a summary. It may not cover all possible information. If you have questions about this medicine, talk to your doctor, pharmacist, or health care provider.  2020 Elsevier/[employer] Standard (2018-06-08 10:54:22)
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
## 2019-05-10 ENCOUNTER — Ambulatory Visit: Payer: BC Managed Care – PPO | Admitting: Family Medicine

## 2019-05-26 ENCOUNTER — Other Ambulatory Visit: Payer: Self-pay

## 2019-05-27 ENCOUNTER — Ambulatory Visit: Payer: BC Managed Care – PPO | Admitting: Family Medicine

## 2019-05-27 VITALS — BP 128/73 | HR 76 | Temp 99.1°F | Ht 68.0 in | Wt 195.0 lb

## 2019-05-27 DIAGNOSIS — I10 Essential (primary) hypertension: Secondary | ICD-10-CM | POA: Diagnosis not present

## 2019-05-27 DIAGNOSIS — T380X5A Adverse effect of glucocorticoids and synthetic analogues, initial encounter: Secondary | ICD-10-CM

## 2019-05-27 DIAGNOSIS — E099 Drug or chemical induced diabetes mellitus without complications: Secondary | ICD-10-CM | POA: Diagnosis not present

## 2019-05-27 DIAGNOSIS — K219 Gastro-esophageal reflux disease without esophagitis: Secondary | ICD-10-CM

## 2019-05-27 DIAGNOSIS — G7000 Myasthenia gravis without (acute) exacerbation: Secondary | ICD-10-CM

## 2019-05-27 MED ORDER — PANTOPRAZOLE SODIUM 40 MG PO TBEC: 40.0000 mg | DELAYED_RELEASE_TABLET | Freq: Every day | ORAL | 3 refills | Status: DC

## 2019-05-27 MED ORDER — METFORMIN HCL 1000 MG PO TABS: 1000.0000 mg | ORAL_TABLET | Freq: Two times a day (BID) | ORAL | 3 refills | Status: DC

## 2019-05-27 NOTE — Patient Instructions (Addendum)
Increase the Metformin to 1000mg  TWICE daily (this will be 2 of the 500mg  tablets you have at home TWICE daily).  I have sent the 1000mg  tablets to your pharmacy to start when you finish your current supply.  You will take just 1 of those twice daily.  We will plan for fasting labs at next visit.  Please make sure you do not have anything to eat or drink (EXCEPT water) that morning. You MAY take you blood pressure medication.  Ok to HOLD the metformin that morning since you will be skipping breakfast.  Please have your eye doctor send me your most recent exam findings.   Diabetes Mellitus and Standards of Medical Care Managing diabetes (diabetes mellitus) can be complicated. Your diabetes treatment may be managed by a team of health care providers, including:  A physician who specializes in diabetes (endocrinologist).  A nurse practitioner or physician assistant.  Nurses.  A diet and nutrition specialist (registered dietitian).  A certified diabetes educator (CDE).  An exercise specialist.  A pharmacist.  An eye doctor.  A foot specialist (podiatrist).  A dentist.  A primary care provider.  A mental health provider. Your health care providers follow guidelines to help you get the best quality of care. The following schedule is a general guideline for your diabetes management plan. Your health care providers may give you more specific instructions. Physical exams Upon being diagnosed with diabetes mellitus, and each year after that, your health care provider will ask about your medical and family history. He or she will also do a physical exam. Your exam may include:  Measuring your height, weight, and body mass index (BMI).  Checking your blood pressure. This will be done at every routine medical visit. Your target blood pressure may vary depending on your medical conditions, your age, and other factors.  Thyroid gland exam.  Skin exam.  Screening for damage to your  nerves (peripheral neuropathy). This may include checking the pulse in your legs and feet and checking the level of sensation in your hands and feet.  A complete foot exam to inspect the structure and skin of your feet, including checking for cuts, bruises, redness, blisters, sores, or other problems.  Screening for blood vessel (vascular) problems, which may include checking the pulse in your legs and feet and checking your temperature. Blood tests Depending on your treatment plan and your personal needs, you may have the following tests done:  HbA1c (hemoglobin A1c). This test provides information about blood sugar (glucose) control over the previous 2-3 months. It is used to adjust your treatment plan, if needed. This test will be done: ? At least 2 times a year, if you are meeting your treatment goals. ? 4 times a year, if you are not meeting your treatment goals or if treatment goals have changed.  Lipid testing, including total, LDL, and HDL cholesterol and triglyceride levels. ? The goal for LDL is less than 100 mg/dL (5.5 mmol/L). If you are at high risk for complications, the goal is less than 70 mg/dL (3.9 mmol/L). ? The goal for HDL is 40 mg/dL (2.2 mmol/L) or higher for men and 50 mg/dL (2.8 mmol/L) or higher for women. An HDL cholesterol of 60 mg/dL (3.3 mmol/L) or higher gives some protection against heart disease. ? The goal for triglycerides is less than 150 mg/dL (8.3 mmol/L).  Liver function tests.  Kidney function tests.  Thyroid function tests. Dental and eye exams  Visit your dentist two times a  year.  If you have type 1 diabetes, your health care provider may recommend an eye exam 3-5 years after you are diagnosed, and then once a year after your first exam. ? For children with type 1 diabetes, a health care provider may recommend an eye exam when your child is age 34 or older and has had diabetes for 3-5 years. After the first exam, your child should get an eye exam  once a year.  If you have type 2 diabetes, your health care provider may recommend an eye exam as soon as you are diagnosed, and then once a year after your first exam. Immunizations   The yearly flu (influenza) vaccine is recommended for everyone 6 months or older who has diabetes.  The pneumonia (pneumococcal) vaccine is recommended for everyone 2 years or older who has diabetes. If you are 27 or older, you may get the pneumonia vaccine as a series of two separate shots.  The hepatitis B vaccine is recommended for adults shortly after being diagnosed with diabetes.  Adults and children with diabetes should receive all other vaccines according to age-specific recommendations from the Centers for Disease Control and Prevention (CDC). Mental and emotional health Screening for symptoms of eating disorders, anxiety, and depression is recommended at the time of diagnosis and afterward as needed. If your screening shows that you have symptoms (positive screening result), you may need more evaluation and you may work with a mental health care provider. Treatment plan Your treatment plan will be reviewed at every medical visit. You and your health care provider will discuss:  How you are taking your medicines, including insulin.  Any side effects you are experiencing.  Your blood glucose target goals.  The frequency of your blood glucose monitoring.  Lifestyle habits, such as activity level as well as tobacco, alcohol, and substance use. Diabetes self-management education Your health care provider will assess how well you are monitoring your blood glucose levels and whether you are taking your insulin correctly. He or she may refer you to:  A certified diabetes educator to manage your diabetes throughout your life, starting at diagnosis.  A registered dietitian who can create or review your personal nutrition plan.  An exercise specialist who can discuss your activity level and exercise  plan. Summary  Managing diabetes (diabetes mellitus) can be complicated. Your diabetes treatment may be managed by a team of health care providers.  Your health care providers follow guidelines in order to help you get the best quality of care.  Standards of care including having regular physical exams, blood tests, blood pressure monitoring, immunizations, screening tests, and education about how to manage your diabetes.  Your health care providers may also give you more specific instructions based on your individual health. This information is not intended to replace advice given to you by your health care provider. Make sure you discuss any questions you have with your health care provider. Document Released: 07/06/2009 Document Revised: 05/28/2018 Document Reviewed: 06/06/2016 Elsevier Patient Education  2020 [employer].

## 2019-05-27 NOTE — Progress Notes (Signed)
Subjective: CC: drug induced DM PCP: Kenneth Norlander, DO [id number] Kenneth Fields is a 58 y.o. male presenting to clinic today for:  1. T2DM w/ HTN and HLD; myasthenia gravis Patient was seen about 1 month ago for new onset type 2 diabetes thought to be secondary to therapeutic corticosteroid use.  He was started on metformin and advised to maintain a blood sugar log.  He brings this today and blood sugars have on average ranged 140s to 250s.  His max blood sugar was 344.  This is down from greater than 500 previously.  Since our last visit he has seen his neurologist who recommended prednisone taper with transition over to CellCept/ongoing treatment with IVIG. He continues benazapril, crestor daily.  With regards to his myasthenia gravis he does note ongoing fatigue with certain activities.  He denies any return of diplopia.  No myalgia or arthralgias.  Last eye exam: needs Last foot exam: needs Last A1c:  Lab Results  Component Value Date   HGBA1C 10.3 (H) 04/25/2019   Nephropathy screen indicated?: on ACE-I Last flu, zoster and/or pneumovax:  Immunization History  Administered Date(s) Administered  . Pneumococcal Polysaccharide-23 07/23/2014  . Tdap 09/22/2009, 02/14/2013    ROS: Denies dizziness, LOC.  Continues to have some polyuria, polydipsia but this is improving.  Denies unintended weight loss/gain, foot ulcerations, numbness or tingling in extremities, shortness of breath or chest pain.  2.  GERD Patient reports ongoing reflux symptoms despite twice daily use of omeprazole 20 mg daily.  He notes that certain foods will give him reflux symptoms.  No nausea, vomiting.  He has gained some weight since our last visit he is pleased about this.  No blood in stool.  ROS: Per HPI  Allergies  Allergen Reactions  . Chantix [Varenicline Tartrate]     "mental problems"   Past Medical History:  Diagnosis Date  . High cholesterol   . Hypertension   . Myasthenia gravis ([name])   .  Myasthenia gravis ([place])     Current Outpatient Medications:  .  aspirin EC 81 MG tablet, Take 81 mg by mouth daily., Disp: , Rfl:  .  benazepril (LOTENSIN) 20 MG tablet, Take 20 mg by mouth daily., Disp: , Rfl:  .  cetirizine (ZYRTEC) 10 MG tablet, Take 10 mg by mouth daily as needed for allergies., Disp: , Rfl:  .  diltiazem (CARDIZEM CD) 120 MG 24 hr capsule, Take 120 mg by mouth daily., Disp: , Rfl:  .  metFORMIN (GLUCOPHAGE) 500 MG tablet, Take 1 tablet (500 mg total) by mouth daily with breakfast for 14 days, THEN 1 tablet (500 mg total) 2 (two) times daily with a meal., Disp: 180 tablet, Rfl: 0 .  mycophenolate (CELLCEPT) 500 MG tablet, Take 2 tablets (1,000 mg total) by mouth 2 (two) times daily. May further increase at patient's next appointment, Disp: 360 tablet, Rfl: 4 .  pyridostigmine (MESTINON) 60 MG tablet, Take 120 mg by mouth. Three times daily (6 AM, lunch, 6 PM)., Disp: , Rfl:  .  rosuvastatin (CRESTOR) 10 MG tablet, Take 10 mg by mouth daily., Disp: , Rfl:  Social History   Socioeconomic History  . Marital status: Married    Spouse name: Not on file  . Number of children: Not on file  . Years of education: Not on file  . Highest education level: 11th grade  Occupational History  . Not on file  Social Needs  . Financial resource strain: Not on file  .  Food insecurity    Worry: Not on file    Inability: Not on file  . Transportation needs    Medical: Not on file    Non-medical: Not on file  Tobacco Use  . Smoking status: Current Every Day Smoker    Packs/day: 1.00    Years: 40.00    Pack years: 40.00    Types: Cigarettes  . Smokeless tobacco: Never Used  Substance and Sexual Activity  . Alcohol use: Yes    Alcohol/week: 14.0 - 21.0 standard drinks    Types: 14 - 21 Cans of beer per week    Comment: 3 beers per night  . Drug use: Never  . Sexual activity: Not on file  Lifestyle  . Physical activity    Days per week: Not on file    Minutes per session:  Not on file  . Stress: Not on file  Relationships  . Social Herbalist on phone: Not on file    Gets together: Not on file    Attends religious service: Not on file    Active member of club or organization: Not on file    Attends meetings of clubs or organizations: Not on file    Relationship status: Not on file  . Intimate partner violence    Fear of current or ex partner: Not on file    Emotionally abused: Not on file    Physically abused: Not on file    Forced sexual activity: Not on file  Other Topics Concern  . Not on file  Social History Narrative   Live at home with his wife   Right handed   Caffeine: 1 diet mtn dew daily, occasional tea    Family History  Problem Relation Age of Onset  . Lung cancer Mother   . Cancer Mother     Objective: Office vital signs reviewed. BP 128/73   Pulse 76   Temp 99.1 F (37.3 C) (Temporal)   Ht 5\' 8"  (1.727 m)   Wt 195 lb (88.5 kg)   SpO2 95%   BMI 29.65 kg/m   Physical Examination:  General: Awake, alert, well nourished, No acute distress HEENT: Normal, sclera white, MMM Cardio: regular rate and rhythm, S1S2 heard, no murmurs appreciated Pulm: clear to auscultation bilaterally, Mild expiratory wheezes at the bases. No rhonchi or rales; normal work of breathing on room air  Assessment/ Plan: 58 y.o. male   1. 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 ([place]) Plan for A1c at next visit.  I have advised him to go ahead and increase his metformin to 1000 mg twice daily.  We will plan for fasting labs and repeat A1c at next visit.  Plan for foot exam.  He is advised to get a diabetic eye exam done.  If we are able to get his A1c consistently below a 6 we can consider tapering off of the metformin now that prednisone is no longer being administered. - metFORMIN (GLUCOPHAGE) 1000 MG tablet; Take 1 tablet (1,000 mg total) by mouth 2 (two) times daily with a meal.  Dispense: 180 tablet; Refill: 3  2.  Essential hypertension with goal blood pressure less than 130/80 At goal.  Continue current regimen  3. Myasthenia gravis ([place]) Now off prednisone.  Doing well with exception of some fatigue with activity  4. Gastroesophageal reflux disease without esophagitis Plan for trial of Protonix.  He is to discontinue the omeprazole.  If ongoing symptoms we will plan  for referral to gastroenterology for possible EGD.  Question hiatal hernia - pantoprazole (PROTONIX) 40 MG tablet; Take 1 tablet (40 mg total) by mouth daily.  Dispense: 30 tablet; Refill: 3   No orders of the defined types were placed in this encounter.  No orders of the defined types were placed in this encounter.    Kenneth Norlander, DO [place] [phone]

## 2019-05-31 ENCOUNTER — Telehealth: Payer: Self-pay | Admitting: Neurology

## 2019-05-31 NOTE — Telephone Encounter (Signed)
Disability RN Freda Munro with [facility] is asking for a call from RN Romelle Starcher to discuss pt's last office visit.  The date of pt's next appointment was provided.  Please call

## 2019-06-01 NOTE — Telephone Encounter (Signed)
I called Kenneth Fields back and let her know the pt's last visit date, next visit date, and update that pt is still receiving treatment at this time. She verbalized appreciation.

## 2019-06-02 DIAGNOSIS — M25512 Pain in left shoulder: Secondary | ICD-10-CM | POA: Diagnosis not present

## 2019-06-02 DIAGNOSIS — M12812 Other specific arthropathies, not elsewhere classified, left shoulder: Secondary | ICD-10-CM | POA: Diagnosis not present

## 2019-06-02 DIAGNOSIS — M7592 Shoulder lesion, unspecified, left shoulder: Secondary | ICD-10-CM | POA: Diagnosis not present

## 2019-06-02 DIAGNOSIS — G8929 Other chronic pain: Secondary | ICD-10-CM | POA: Diagnosis not present

## 2019-06-02 DIAGNOSIS — G7001 Myasthenia gravis with (acute) exacerbation: Secondary | ICD-10-CM | POA: Diagnosis not present

## 2019-06-03 DIAGNOSIS — M12812 Other specific arthropathies, not elsewhere classified, left shoulder: Secondary | ICD-10-CM | POA: Diagnosis not present

## 2019-06-03 DIAGNOSIS — M7592 Shoulder lesion, unspecified, left shoulder: Secondary | ICD-10-CM | POA: Diagnosis not present

## 2019-06-03 DIAGNOSIS — G8929 Other chronic pain: Secondary | ICD-10-CM | POA: Diagnosis not present

## 2019-06-03 DIAGNOSIS — M25512 Pain in left shoulder: Secondary | ICD-10-CM | POA: Diagnosis not present

## 2019-06-06 DIAGNOSIS — G7001 Myasthenia gravis with (acute) exacerbation: Secondary | ICD-10-CM | POA: Diagnosis not present

## 2019-06-16 NOTE — Telephone Encounter (Signed)
Received IVIG updated orders from [facility] for IVIG every 5 weeks. Order signed by Dr. Jaynee Eagles and faxed back to [facility]. Received a receipt of confirmation.

## 2019-06-16 NOTE — Telephone Encounter (Signed)
I spoke with Tiffany @ [facility] and gave v.o. per Dr. Jaynee Eagles to change pt's IVIG frequency to every 5 weeks.

## 2019-06-21 NOTE — Telephone Encounter (Signed)
Called pt and LVM asking for call back or mychart message. Left office number in message.

## 2019-06-22 ENCOUNTER — Telehealth: Payer: Self-pay | Admitting: *Deleted

## 2019-06-22 DIAGNOSIS — Z0289 Encounter for other administrative examinations: Secondary | ICD-10-CM

## 2019-06-22 NOTE — Telephone Encounter (Signed)
Disability form completed, signed and sent to medical records for processing.

## 2019-06-22 NOTE — Telephone Encounter (Signed)
Pt [employer] form on [employer].

## 2019-06-23 ENCOUNTER — Telehealth: Payer: Self-pay | Admitting: *Deleted

## 2019-06-23 NOTE — Telephone Encounter (Signed)
I faxed pt compass form on 06/23/19 to [phone]

## 2019-07-07 ENCOUNTER — Ambulatory Visit (INDEPENDENT_AMBULATORY_CARE_PROVIDER_SITE_OTHER): Payer: Self-pay | Admitting: Neurology

## 2019-07-07 ENCOUNTER — Encounter: Payer: Self-pay | Admitting: *Deleted

## 2019-07-07 ENCOUNTER — Other Ambulatory Visit: Payer: Self-pay

## 2019-07-07 DIAGNOSIS — G7001 Myasthenia gravis with (acute) exacerbation: Secondary | ICD-10-CM

## 2019-07-07 DIAGNOSIS — Z79899 Other long term (current) drug therapy: Secondary | ICD-10-CM

## 2019-07-07 NOTE — Progress Notes (Signed)
Letter written per Dr. Jaynee Eagles stating pt released to return to work on Monday 07/11/2019. May not work in [place] dedicated wards or in close contact with COVID+ patient otherwise he has no restrictions. Letter was faxed to [facility] @ [phone].

## 2019-07-07 NOTE — Progress Notes (Signed)
fax [phone]
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
## 2019-07-11 ENCOUNTER — Encounter: Payer: Self-pay | Admitting: *Deleted

## 2019-07-12 ENCOUNTER — Encounter: Payer: Self-pay | Admitting: *Deleted

## 2019-07-13 ENCOUNTER — Telehealth: Payer: Self-pay | Admitting: *Deleted

## 2019-07-13 NOTE — Telephone Encounter (Addendum)
Received post infusion report for Panzyga IVIG 90 grams given on 06/06/2019. There were no side effects or adverse reactions noted, no access or infusion issues noted, and patient tolerated infusion well.

## 2019-07-18 ENCOUNTER — Other Ambulatory Visit: Payer: Self-pay | Admitting: Family Medicine

## 2019-07-18 DIAGNOSIS — E099 Drug or chemical induced diabetes mellitus without complications: Secondary | ICD-10-CM

## 2019-07-18 DIAGNOSIS — T380X5A Adverse effect of glucocorticoids and synthetic analogues, initial encounter: Secondary | ICD-10-CM

## 2019-07-20 ENCOUNTER — Ambulatory Visit: Payer: BC Managed Care – PPO | Admitting: Neurology

## 2019-07-25 ENCOUNTER — Other Ambulatory Visit: Payer: Self-pay | Admitting: *Deleted

## 2019-07-25 ENCOUNTER — Other Ambulatory Visit: Payer: Self-pay | Admitting: Neurology

## 2019-07-25 DIAGNOSIS — G7000 Myasthenia gravis without (acute) exacerbation: Secondary | ICD-10-CM

## 2019-07-25 MED ORDER — MYCOPHENOLATE MOFETIL 500 MG PO TABS: 1000.0000 mg | ORAL_TABLET | Freq: Two times a day (BID) | ORAL | 0 refills | Status: DC

## 2019-07-25 NOTE — Telephone Encounter (Signed)
Per Dr. Jaynee Eagles, ok to send one week's worth of Mycophenolate to [facility].   Refill sent #28 tablets.

## 2019-07-27 ENCOUNTER — Ambulatory Visit: Payer: BC Managed Care – PPO | Admitting: Family Medicine

## 2019-08-11 DIAGNOSIS — G7001 Myasthenia gravis with (acute) exacerbation: Secondary | ICD-10-CM | POA: Diagnosis not present

## 2019-08-11 NOTE — Telephone Encounter (Signed)
Received post infusion report for Panzyga IVIG 90 grams given on 07/13/2019. There were no side effects or adverse reactions noted, no access or infusion issues noted, and patient tolerated infusion well.

## 2019-08-17 ENCOUNTER — Ambulatory Visit: Payer: Self-pay | Admitting: Family Medicine

## 2019-08-17 ENCOUNTER — Ambulatory Visit: Payer: BC Managed Care – PPO | Admitting: Family Medicine

## 2019-08-17 ENCOUNTER — Other Ambulatory Visit: Payer: Self-pay

## 2019-08-17 VITALS — BP 139/85 | HR 67 | Temp 99.2°F | Ht 68.0 in | Wt 200.0 lb

## 2019-08-17 DIAGNOSIS — T380X5A Adverse effect of glucocorticoids and synthetic analogues, initial encounter: Secondary | ICD-10-CM | POA: Diagnosis not present

## 2019-08-17 DIAGNOSIS — G7000 Myasthenia gravis without (acute) exacerbation: Secondary | ICD-10-CM

## 2019-08-17 DIAGNOSIS — I10 Essential (primary) hypertension: Secondary | ICD-10-CM | POA: Diagnosis not present

## 2019-08-17 DIAGNOSIS — E1159 Type 2 diabetes mellitus with other circulatory complications: Secondary | ICD-10-CM

## 2019-08-17 DIAGNOSIS — E1169 Type 2 diabetes mellitus with other specified complication: Secondary | ICD-10-CM | POA: Diagnosis not present

## 2019-08-17 DIAGNOSIS — K219 Gastro-esophageal reflux disease without esophagitis: Secondary | ICD-10-CM

## 2019-08-17 DIAGNOSIS — E099 Drug or chemical induced diabetes mellitus without complications: Secondary | ICD-10-CM

## 2019-08-17 DIAGNOSIS — E785 Hyperlipidemia, unspecified: Secondary | ICD-10-CM

## 2019-08-17 DIAGNOSIS — I152 Hypertension secondary to endocrine disorders: Secondary | ICD-10-CM

## 2019-08-17 LAB — BAYER DCA HB A1C WAIVED: HB A1C (BAYER DCA - WAIVED): 6.1 % (ref <7.0)

## 2019-08-17 MED ORDER — METFORMIN HCL 1000 MG PO TABS: 500.0000 mg | ORAL_TABLET | Freq: Two times a day (BID) | ORAL | 3 refills | Status: DC

## 2019-08-17 NOTE — Progress Notes (Signed)
Subjective: CC: drug induced DM PCP: [name], DO EAV:WUJW JYMIR DUNAJ is a 58 y.o. male presenting to clinic today for:  1. T2DM w/ HTN and HLD; myasthenia gravis Patient was seen about 2 months ago for type 2 diabetes thought to be secondary to therapeutic corticosteroid use.  At last visit his Metformin was increased to 1000 mg twice daily.  He also takes Crestor 10 mg daily and benazepril 20 mg daily.  He notes that he was unable to tolerate Metformin 1000 mg twice daily so he reduced back to 500 mg twice daily.  Blood sugars have been running in the low 100s to below 100.  Nothing above 120.  Last eye exam: UTD Last foot exam: UTD Last A1c:  Lab Results  Component Value Date   HGBA1C 10.3 (H) 04/25/2019   Nephropathy screen indicated?: on ACE-I Last flu, zoster and/or pneumovax:  Immunization History  Administered Date(s) Administered  . Pneumococcal Polysaccharide-23 07/23/2014  . Tdap 09/22/2009, 02/14/2013    ROS: No chest pain, shortness of breath, lower extremity edema, numbness or tingling.  No visual disturbance.  2.  GERD At last visit his omeprazole was discontinued and he was transitioned over to pantoprazole.  He notes that symptoms have been under excellent control with the switch.  He denies any reflux symptoms.  No abdominal pain, nausea, vomiting.  ROS: Per HPI  Allergies  Allergen Reactions  . Chantix [Varenicline Tartrate]     "mental problems"   Past Medical History:  Diagnosis Date  . High cholesterol   . Hypertension   . Myasthenia gravis ([place])   . Myasthenia gravis ([place])     Current Outpatient Medications:  .  aspirin EC 81 MG tablet, Take 81 mg by mouth daily., Disp: , Rfl:  .  benazepril (LOTENSIN) 20 MG tablet, Take 20 mg by mouth daily., Disp: , Rfl:  .  cetirizine (ZYRTEC) 10 MG tablet, Take 10 mg by mouth daily as needed for allergies., Disp: , Rfl:  .  diltiazem (CARDIZEM CD) 120 MG 24 hr capsule, Take 120 mg by mouth daily.,  Disp: , Rfl:  .  metFORMIN (GLUCOPHAGE) 1000 MG tablet, Take 1 tablet (1,000 mg total) by mouth 2 (two) times daily with a meal., Disp: 180 tablet, Rfl: 3 .  mycophenolate (CELLCEPT) 500 MG tablet, Take 2 tablets (1,000 mg total) by mouth 2 (two) times daily., Disp: 28 tablet, Rfl: 0 .  pantoprazole (PROTONIX) 40 MG tablet, Take 1 tablet (40 mg total) by mouth daily., Disp: 30 tablet, Rfl: 3 .  pyridostigmine (MESTINON) 60 MG tablet, Take 120 mg by mouth. Three times daily (6 AM, lunch, 6 PM)., Disp: , Rfl:  .  rosuvastatin (CRESTOR) 10 MG tablet, Take 10 mg by mouth daily., Disp: , Rfl:  Social History   Socioeconomic History  . Marital status: Married    Spouse name: Not on file  . Number of children: Not on file  . Years of education: Not on file  . Highest education level: 11th grade  Occupational History  . Not on file  Social Needs  . Financial resource strain: Not on file  . Food insecurity    Worry: Not on file    Inability: Not on file  . Transportation needs    Medical: Not on file    Non-medical: Not on file  Tobacco Use  . Smoking status: Current Every Day Smoker    Packs/day: 1.00    Years: 40.00  Pack years: 40.00    Types: Cigarettes  . Smokeless tobacco: Never Used  Substance and Sexual Activity  . Alcohol use: Yes    Alcohol/week: 14.0 - 21.0 standard drinks    Types: 14 - 21 Cans of beer per week    Comment: 3 beers per night  . Drug use: Never  . Sexual activity: Not on file  Lifestyle  . Physical activity    Days per week: Not on file    Minutes per session: Not on file  . Stress: Not on file  Relationships  . Social connections    Talks on phone: Not on file    Gets together: Not on file    Attends religious service: Not on file    Active member of club or organization: Not on file    Attends meetings of clubs or organizations: Not on file    Relationship status: Not on file  . Intimate partner violence    Fear of current or ex partner: Not  on file    Emotionally abused: Not on file    Physically abused: Not on file    Forced sexual activity: Not on file  Other Topics Concern  . Not on file  Social History Narrative   Live at home with his wife   Right handed   Caffeine: 1 diet mtn dew daily, occasional tea    Family History  Problem Relation Age of Onset  . Lung cancer Mother   . Cancer Mother     Objective: Office vital signs reviewed. BP 139/85   Pulse 67   Temp 99.2 F (37.3 C) (Temporal)   Ht 5' 8" (1.727 m)   Wt 200 lb (90.7 kg)   SpO2 97%   BMI 30.41 kg/m   Physical Examination:  General: Awake, alert, well nourished, No acute distress HEENT: Normal, sclera white, MMM Cardio: regular rate and rhythm, S1S2 heard, no murmurs appreciated Pulm: clear to auscultation bilaterally, Mild expiratory wheezes at the bases. No rhonchi or rales; normal work of breathing on room air  Assessment/ Plan: 58 y.o. male   1. 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 (HCC) Under excellent control with A1c of 6.1 today.  Okay to continue Metformin 500 mg twice daily.  If A1c is below 6 next visit, we can trial off of Metformin.  He seemed very amenable to this - Bayer DCA Hb A1c Waived - Lipid Panel  2. Hypertension associated with diabetes (HCC) Controlled.  Continue current regimen - CMP14+EGFR  3. Hyperlipidemia associated with type 2 diabetes mellitus (HCC) Continue statin - CMP14+EGFR - Lipid Panel  4. Myasthenia gravis (HCC) Off of prednisone.  Getting infusions and using the CellCept only at this time - CMP14+EGFR - CBC  5. Gastroesophageal reflux disease without esophagitis Well-controlled.  Continue current regimen    Orders Placed This Encounter  Procedures  . Bayer DCA Hb A1c Waived  . CMP14+EGFR  . Lipid Panel  . CBC   Meds ordered this encounter  Medications  . metFORMIN (GLUCOPHAGE) 1000 MG tablet    Sig: Take 0.5 tablets (500 mg total) by mouth 2 (two) times daily  with a meal.    Dispense:  180 tablet    Refill:  3     Ashly M Gottschalk, DO [facility] [phone]   

## 2019-08-18 LAB — CMP14+EGFR
ALT: 15 IU/L (ref 0–44)
AST: 20 IU/L (ref 0–40)
Albumin/Globulin Ratio: 1 — ABNORMAL LOW (ref 1.2–2.2)
Albumin: 4.1 g/dL (ref 3.8–4.9)
Alkaline Phosphatase: 80 IU/L (ref 39–117)
BUN/Creatinine Ratio: 14 (ref 9–20)
BUN: 12 mg/dL (ref 6–24)
Bilirubin Total: 0.3 mg/dL (ref 0.0–1.2)
CO2: 21 mmol/L (ref 20–29)
Calcium: 9.3 mg/dL (ref 8.7–10.2)
Chloride: 101 mmol/L (ref 96–106)
Creatinine, Ser: 0.87 mg/dL (ref 0.76–1.27)
GFR calc Af Amer: 110 mL/min/{1.73_m2} (ref >59)
GFR calc non Af Amer: 95 mL/min/{1.73_m2} (ref >59)
Globulin, Total: 4.2 g/dL (ref 1.5–4.5)
Glucose: 109 mg/dL — ABNORMAL HIGH (ref 65–99)
Potassium: 4.1 mmol/L (ref 3.5–5.2)
Sodium: 137 mmol/L (ref 134–144)
Total Protein: 8.3 g/dL (ref 6.0–8.5)

## 2019-08-18 LAB — CBC
Hematocrit: 47.9 % (ref 37.5–51.0)
Hemoglobin: 16.1 g/dL (ref 13.0–17.7)
MCH: 30.5 pg (ref 26.6–33.0)
MCHC: 33.6 g/dL (ref 31.5–35.7)
MCV: 91 fL (ref 79–97)
Platelets: 205 10*3/uL (ref 150–450)
RBC: 5.28 x10E6/uL (ref 4.14–5.80)
RDW: 12.5 % (ref 11.6–15.4)
WBC: 4 10*3/uL (ref 3.4–10.8)

## 2019-08-18 LAB — LIPID PANEL
Chol/HDL Ratio: 4.1 ratio (ref 0.0–5.0)
Cholesterol, Total: 174 mg/dL (ref 100–199)
HDL: 42 mg/dL (ref >39)
LDL Chol Calc (NIH): 90 mg/dL (ref 0–99)
Triglycerides: 248 mg/dL — ABNORMAL HIGH (ref 0–149)
VLDL Cholesterol Cal: 42 mg/dL — ABNORMAL HIGH (ref 5–40)

## 2019-08-23 ENCOUNTER — Other Ambulatory Visit: Payer: Self-pay

## 2019-08-23 MED ORDER — ROSUVASTATIN CALCIUM 20 MG PO TABS: 20.0000 mg | ORAL_TABLET | Freq: Every day | ORAL | 0 refills | Status: DC

## 2019-08-28 ENCOUNTER — Other Ambulatory Visit: Payer: Self-pay | Admitting: Family Medicine

## 2019-08-28 DIAGNOSIS — K219 Gastro-esophageal reflux disease without esophagitis: Secondary | ICD-10-CM

## 2019-08-29 NOTE — Telephone Encounter (Signed)
ivig infusion plan of care and home health certification form was signed by Dr. Jaynee Eagles and faxed back to [facility]. [place]zyga 90 grams. Received a receipt of confirmation.

## 2019-09-20 DIAGNOSIS — G7001 Myasthenia gravis with (acute) exacerbation: Secondary | ICD-10-CM | POA: Diagnosis not present

## 2019-09-27 DIAGNOSIS — Z9189 Other specified personal risk factors, not elsewhere classified: Secondary | ICD-10-CM | POA: Diagnosis not present

## 2019-11-07 ENCOUNTER — Other Ambulatory Visit: Payer: Self-pay | Admitting: Family Medicine

## 2019-11-07 DIAGNOSIS — T380X5A Adverse effect of glucocorticoids and synthetic analogues, initial encounter: Secondary | ICD-10-CM

## 2019-11-07 DIAGNOSIS — E099 Drug or chemical induced diabetes mellitus without complications: Secondary | ICD-10-CM

## 2019-11-15 ENCOUNTER — Other Ambulatory Visit: Payer: Self-pay | Admitting: Family Medicine

## 2019-11-18 ENCOUNTER — Other Ambulatory Visit: Payer: Self-pay

## 2019-11-21 ENCOUNTER — Other Ambulatory Visit: Payer: Self-pay

## 2019-11-21 ENCOUNTER — Ambulatory Visit (INDEPENDENT_AMBULATORY_CARE_PROVIDER_SITE_OTHER): Payer: BC Managed Care – PPO | Admitting: Family Medicine

## 2019-11-21 VITALS — BP 152/83 | HR 63

## 2019-11-21 DIAGNOSIS — E1159 Type 2 diabetes mellitus with other circulatory complications: Secondary | ICD-10-CM | POA: Diagnosis not present

## 2019-11-21 DIAGNOSIS — E1169 Type 2 diabetes mellitus with other specified complication: Secondary | ICD-10-CM

## 2019-11-21 DIAGNOSIS — G7000 Myasthenia gravis without (acute) exacerbation: Secondary | ICD-10-CM

## 2019-11-21 DIAGNOSIS — K219 Gastro-esophageal reflux disease without esophagitis: Secondary | ICD-10-CM

## 2019-11-21 DIAGNOSIS — I10 Essential (primary) hypertension: Secondary | ICD-10-CM

## 2019-11-21 DIAGNOSIS — Z114 Encounter for screening for human immunodeficiency virus [HIV]: Secondary | ICD-10-CM

## 2019-11-21 DIAGNOSIS — E785 Hyperlipidemia, unspecified: Secondary | ICD-10-CM

## 2019-11-21 DIAGNOSIS — E099 Drug or chemical induced diabetes mellitus without complications: Secondary | ICD-10-CM

## 2019-11-21 DIAGNOSIS — T380X5A Adverse effect of glucocorticoids and synthetic analogues, initial encounter: Secondary | ICD-10-CM

## 2019-11-21 LAB — BAYER DCA HB A1C WAIVED: HB A1C (BAYER DCA - WAIVED): 5.8 % (ref <7.0)

## 2019-11-21 MED ORDER — DILTIAZEM HCL ER COATED BEADS 120 MG PO CP24: 120.0000 mg | ORAL_CAPSULE | Freq: Every day | ORAL | 3 refills | Status: DC

## 2019-11-21 MED ORDER — ROSUVASTATIN CALCIUM 20 MG PO TABS: 20.0000 mg | ORAL_TABLET | Freq: Every day | ORAL | 3 refills | Status: DC

## 2019-11-21 MED ORDER — PANTOPRAZOLE SODIUM 40 MG PO TBEC: 40.0000 mg | DELAYED_RELEASE_TABLET | Freq: Every day | ORAL | 3 refills | Status: DC

## 2019-11-21 MED ORDER — BENAZEPRIL HCL 20 MG PO TABS: 20.0000 mg | ORAL_TABLET | Freq: Every day | ORAL | 3 refills | Status: DC

## 2019-11-21 MED ORDER — BENAZEPRIL HCL 40 MG PO TABS: 40.0000 mg | ORAL_TABLET | Freq: Every day | ORAL | 3 refills | Status: DC

## 2019-11-21 NOTE — Progress Notes (Signed)
Patient aware and will start increased dosage of Benazepril.

## 2019-11-21 NOTE — Progress Notes (Signed)
Patient here to have blood pressure checked. First reading 160/91, pulse 66 Rechecked at 152/83, pulse 63

## 2019-11-21 NOTE — Progress Notes (Signed)
Telephone visit  Subjective: CC: f/u DM2 PCP: Kenneth Fields GBT:Kenneth Fields is a 59 y.o. male calls for telephone consult today. Patient provides verbal consent for consult held via phone.  Due to COVID-19 pandemic this visit was conducted virtually. This visit type was conducted due to national recommendations for restrictions regarding the COVID-19 Pandemic (e.g. social distancing, sheltering in place) in an effort to limit this patient's exposure and mitigate transmission in our community. All issues noted in this document were discussed and addressed.  A physical exam was not performed with this format.   Location of patient: home Location of provider: Working remotely from home Others present for call: none  1. Type 2 Diabetes w/ HTN, HLD: Thought to be steroid induced DM, in the setting of Myasthenia Gravis.   Patient reports Taking medication(s): Metformin '500mg'$  BID (has been off of metformin for 4 days because he ran out), benazepril 20 mg daily, rosuvastatin 20 mg daily (this was increased at last visit due to ASCVD risk of greater than 29%).  He is tolerating increased dose without difficulty.  Does not monitor BPs at home.  Blood sugars have been running around the 120s.  No blood sugars less than 100.  Last eye exam: UTD Last foot exam: UTD Last A1c:  Lab Results  Component Value Date   HGBA1C 6.1 08/17/2019   Nephropathy screen indicated?: on ACE-I Last flu, zoster and/or pneumovax:  Immunization History  Administered Date(s) Administered  . Pneumococcal Polysaccharide-23 07/23/2014  . Tdap 09/22/2009, 02/14/2013    ROS: Denies dizziness, visual disturbance, chest pain or shortness of breath  2. Myasthenia Gravis He had his infusion on Friday.  He has appointment on Wednesday at 8am.  Symptoms are well controlled.  No double vision or eyelid drooping.  He is compliant with Cellcept  ROS: Per HPI  Allergies  Allergen Reactions  . Chantix [Varenicline  Tartrate]     "mental problems"   Past Medical History:  Diagnosis Date  . High cholesterol   . Hypertension   . Myasthenia gravis ([place])   . Myasthenia gravis ([place])     Current Outpatient Medications:  .  aspirin EC 81 MG tablet, Take 81 mg by mouth daily., Disp: , Rfl:  .  benazepril (LOTENSIN) 20 MG tablet, Take 20 mg by mouth daily., Disp: , Rfl:  .  cetirizine (ZYRTEC) 10 MG tablet, Take 10 mg by mouth daily as needed for allergies., Disp: , Rfl:  .  diltiazem (CARDIZEM CD) 120 MG 24 hr capsule, Take 120 mg by mouth daily., Disp: , Rfl:  .  metFORMIN (GLUCOPHAGE) 1000 MG tablet, Take 0.5 tablets (500 mg total) by mouth 2 (two) times daily with a meal., Disp: 180 tablet, Rfl: 3 .  mycophenolate (CELLCEPT) 500 MG tablet, Take 2 tablets (1,000 mg total) by mouth 2 (two) times daily., Disp: 28 tablet, Rfl: 0 .  pantoprazole (PROTONIX) 40 MG tablet, TAKE 1 TABLET BY MOUTH EVERY DAY, Disp: 90 tablet, Rfl: 1 .  pyridostigmine (MESTINON) 60 MG tablet, Take 120 mg by mouth. Three times daily (6 AM, lunch, 6 PM)., Disp: , Rfl:  .  rosuvastatin (CRESTOR) 20 MG tablet, TAKE 1 TABLET BY MOUTH EVERY DAY, Disp: 90 tablet, Rfl: 0  Assessment/ Plan: 59 y.o. male   1. 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 (HCC) Blood sugars are stable.  We discussed that if his A1c is less than 6, may be able to stop the Metformin totally and  recheck in 3 months.  However, if still above 6 would recommend continuing metformin 500 mg twice daily as prescribed. - CMP14+EGFR - Bayer DCA Hb A1c Waived  2. Hypertension associated with diabetes ([place]) Has been somewhat labile but seemingly related to infusions.  He is to keep a close eye on his blood pressures and if above goal of 140/90, low threshold to increase his benazepril - CMP14+EGFR  3. Hyperlipidemia associated with type 2 diabetes mellitus (HCC) Continue statin.  Plan for fasting lipid panel this morning and repeat LFTs given increased  dose at last visit - Lipid Panel - CMP14+EGFR  4. Myasthenia gravis ([place]) Has a visit with neurology coming up soon. - CBC  Start time: 8:03am End time: 8:16am  Total time spent on patient care (including telephone call/ virtual visit): 22 minutes  [place], [place] [phone]

## 2019-11-21 NOTE — Addendum Note (Signed)
Addended by: [name] on: 11/21/2019 09:19 AM   Modules accepted: Orders

## 2019-11-22 LAB — CBC
Hematocrit: 45.6 % (ref 37.5–51.0)
Hemoglobin: 16.2 g/dL (ref 13.0–17.7)
MCH: 31.6 pg (ref 26.6–33.0)
MCHC: 35.5 g/dL (ref 31.5–35.7)
MCV: 89 fL (ref 79–97)
Platelets: 224 10*3/uL (ref 150–450)
RBC: 5.12 x10E6/uL (ref 4.14–5.80)
RDW: 13.9 % (ref 11.6–15.4)
WBC: 6.5 10*3/uL (ref 3.4–10.8)

## 2019-11-22 LAB — HIV ANTIBODY (ROUTINE TESTING W REFLEX): HIV Screen 4th Generation wRfx: NONREACTIVE

## 2019-11-22 LAB — CMP14+EGFR
ALT: 22 IU/L (ref 0–44)
AST: 34 IU/L (ref 0–40)
Albumin/Globulin Ratio: 1.3 (ref 1.2–2.2)
Albumin: 4.5 g/dL (ref 3.8–4.9)
Alkaline Phosphatase: 75 IU/L (ref 39–117)
BUN/Creatinine Ratio: 14 (ref 9–20)
BUN: 13 mg/dL (ref 6–24)
Bilirubin Total: 0.4 mg/dL (ref 0.0–1.2)
CO2: 19 mmol/L — ABNORMAL LOW (ref 20–29)
Calcium: 9.8 mg/dL (ref 8.7–10.2)
Chloride: 101 mmol/L (ref 96–106)
Creatinine, Ser: 0.92 mg/dL (ref 0.76–1.27)
GFR calc Af Amer: 106 mL/min/{1.73_m2} (ref >59)
GFR calc non Af Amer: 91 mL/min/{1.73_m2} (ref >59)
Globulin, Total: 3.4 g/dL (ref 1.5–4.5)
Glucose: 113 mg/dL — ABNORMAL HIGH (ref 65–99)
Potassium: 4.2 mmol/L (ref 3.5–5.2)
Sodium: 138 mmol/L (ref 134–144)
Total Protein: 7.9 g/dL (ref 6.0–8.5)

## 2019-11-22 LAB — LIPID PANEL
Chol/HDL Ratio: 3.9 ratio (ref 0.0–5.0)
Cholesterol, Total: 181 mg/dL (ref 100–199)
HDL: 46 mg/dL (ref >39)
LDL Chol Calc (NIH): 76 mg/dL (ref 0–99)
Triglycerides: 367 mg/dL — ABNORMAL HIGH (ref 0–149)
VLDL Cholesterol Cal: 59 mg/dL — ABNORMAL HIGH (ref 5–40)

## 2019-11-23 ENCOUNTER — Other Ambulatory Visit: Payer: Self-pay

## 2019-11-23 ENCOUNTER — Encounter: Payer: Self-pay | Admitting: Neurology

## 2019-11-23 ENCOUNTER — Ambulatory Visit: Payer: BC Managed Care – PPO | Admitting: Neurology

## 2019-11-23 VITALS — BP 155/91 | HR 64 | Temp 98.5°F | Ht 68.0 in | Wt 206.0 lb

## 2019-11-23 DIAGNOSIS — G7000 Myasthenia gravis without (acute) exacerbation: Secondary | ICD-10-CM | POA: Diagnosis not present

## 2019-11-23 DIAGNOSIS — Z1152 Encounter for screening for COVID-19: Secondary | ICD-10-CM | POA: Diagnosis not present

## 2019-11-23 MED ORDER — PYRIDOSTIGMINE BROMIDE 60 MG PO TABS: 30.0000 mg | ORAL_TABLET | Freq: Three times a day (TID) | ORAL | 4 refills | Status: DC | PRN

## 2019-11-23 MED ORDER — MYCOPHENOLATE MOFETIL 500 MG PO TABS: 1000.0000 mg | ORAL_TABLET | Freq: Two times a day (BID) | ORAL | 3 refills | Status: DC

## 2019-11-23 NOTE — Patient Instructions (Signed)
Stop IVIG Mestinon as needed Cotinue the cellcept See you back in 4-6 months or sooner if needed
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
## 2019-11-24 LAB — SAR COV2 SEROLOGY (COVID19)AB(IGG),IA: DiaSorin SARS-CoV-2 Ab, IgG: NEGATIVE

## 2020-03-09 ENCOUNTER — Other Ambulatory Visit: Payer: Self-pay | Admitting: Family Medicine

## 2020-03-09 ENCOUNTER — Other Ambulatory Visit: Payer: Self-pay

## 2020-03-09 ENCOUNTER — Other Ambulatory Visit: Payer: BC Managed Care – PPO

## 2020-03-09 DIAGNOSIS — I152 Hypertension secondary to endocrine disorders: Secondary | ICD-10-CM

## 2020-03-09 DIAGNOSIS — E1169 Type 2 diabetes mellitus with other specified complication: Secondary | ICD-10-CM | POA: Diagnosis not present

## 2020-03-09 DIAGNOSIS — G7000 Myasthenia gravis without (acute) exacerbation: Secondary | ICD-10-CM | POA: Diagnosis not present

## 2020-03-09 DIAGNOSIS — T380X5A Adverse effect of glucocorticoids and synthetic analogues, initial encounter: Secondary | ICD-10-CM | POA: Diagnosis not present

## 2020-03-09 DIAGNOSIS — E1159 Type 2 diabetes mellitus with other circulatory complications: Secondary | ICD-10-CM

## 2020-03-09 DIAGNOSIS — I10 Essential (primary) hypertension: Secondary | ICD-10-CM | POA: Diagnosis not present

## 2020-03-09 DIAGNOSIS — E099 Drug or chemical induced diabetes mellitus without complications: Secondary | ICD-10-CM

## 2020-03-09 DIAGNOSIS — E785 Hyperlipidemia, unspecified: Secondary | ICD-10-CM

## 2020-03-09 LAB — BAYER DCA HB A1C WAIVED: HB A1C (BAYER DCA - WAIVED): 6.5 % (ref <7.0)

## 2020-03-09 NOTE — Addendum Note (Signed)
Addended by: [name] on: 03/09/2020 01:30 PM   Modules accepted: Orders

## 2020-03-10 LAB — CBC WITH DIFFERENTIAL/PLATELET
Basophils Absolute: 0.1 10*3/uL (ref 0.0–0.2)
Basos: 1 %
EOS (ABSOLUTE): 0.3 10*3/uL (ref 0.0–0.4)
Eos: 4 %
Hematocrit: 47.5 % (ref 37.5–51.0)
Hemoglobin: 16.4 g/dL (ref 13.0–17.7)
Immature Grans (Abs): 0 10*3/uL (ref 0.0–0.1)
Immature Granulocytes: 0 %
Lymphocytes Absolute: 1.9 10*3/uL (ref 0.7–3.1)
Lymphs: 25 %
MCH: 31 pg (ref 26.6–33.0)
MCHC: 34.5 g/dL (ref 31.5–35.7)
MCV: 90 fL (ref 79–97)
Monocytes Absolute: 0.5 10*3/uL (ref 0.1–0.9)
Monocytes: 7 %
Neutrophils Absolute: 4.9 10*3/uL (ref 1.4–7.0)
Neutrophils: 63 %
Platelets: 243 10*3/uL (ref 150–450)
RBC: 5.29 x10E6/uL (ref 4.14–5.80)
RDW: 13.2 % (ref 11.6–15.4)
WBC: 7.7 10*3/uL (ref 3.4–10.8)

## 2020-03-10 LAB — CMP14+EGFR
ALT: 21 IU/L (ref 0–44)
AST: 26 IU/L (ref 0–40)
Albumin/Globulin Ratio: 2 (ref 1.2–2.2)
Albumin: 4.6 g/dL (ref 3.8–4.9)
Alkaline Phosphatase: 87 IU/L (ref 48–121)
BUN/Creatinine Ratio: 14 (ref 9–20)
BUN: 12 mg/dL (ref 6–24)
Bilirubin Total: 0.3 mg/dL (ref 0.0–1.2)
CO2: 24 mmol/L (ref 20–29)
Calcium: 9.7 mg/dL (ref 8.7–10.2)
Chloride: 101 mmol/L (ref 96–106)
Creatinine, Ser: 0.83 mg/dL (ref 0.76–1.27)
GFR calc Af Amer: 112 mL/min/{1.73_m2} (ref >59)
GFR calc non Af Amer: 97 mL/min/{1.73_m2} (ref >59)
Globulin, Total: 2.3 g/dL (ref 1.5–4.5)
Glucose: 100 mg/dL — ABNORMAL HIGH (ref 65–99)
Potassium: 5 mmol/L (ref 3.5–5.2)
Sodium: 139 mmol/L (ref 134–144)
Total Protein: 6.9 g/dL (ref 6.0–8.5)

## 2020-03-10 LAB — LIPID PANEL
Chol/HDL Ratio: 5.3 ratio — ABNORMAL HIGH (ref 0.0–5.0)
Cholesterol, Total: 208 mg/dL — ABNORMAL HIGH (ref 100–199)
HDL: 39 mg/dL — ABNORMAL LOW (ref >39)
LDL Chol Calc (NIH): 104 mg/dL — ABNORMAL HIGH (ref 0–99)
Triglycerides: 384 mg/dL — ABNORMAL HIGH (ref 0–149)
VLDL Cholesterol Cal: 65 mg/dL — ABNORMAL HIGH (ref 5–40)

## 2020-03-12 ENCOUNTER — Telehealth: Payer: Self-pay | Admitting: Neurology

## 2020-03-12 ENCOUNTER — Other Ambulatory Visit: Payer: Self-pay | Admitting: Family Medicine

## 2020-03-12 DIAGNOSIS — E1169 Type 2 diabetes mellitus with other specified complication: Secondary | ICD-10-CM

## 2020-03-12 NOTE — Telephone Encounter (Signed)
-----   Message from Larey Seat, MD sent at 03/12/2020 12:56 PM EDT ----- All normal CBC

## 2020-03-12 NOTE — Telephone Encounter (Signed)
Called the pt and reviewed the lab results with him. Advised that lab results were in normal range. Pt verbalized understanding. Pt had no questions at this time but was encouraged to call back if questions arise.

## 2020-03-12 NOTE — Progress Notes (Signed)
All normal CBC

## 2020-03-24 ENCOUNTER — Other Ambulatory Visit: Payer: Self-pay | Admitting: Neurology
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
## 2020-03-29 ENCOUNTER — Other Ambulatory Visit: Payer: Self-pay

## 2020-03-29 ENCOUNTER — Telehealth: Payer: Self-pay | Admitting: Neurology

## 2020-03-29 ENCOUNTER — Telehealth (INDEPENDENT_AMBULATORY_CARE_PROVIDER_SITE_OTHER): Payer: BC Managed Care – PPO | Admitting: Neurology

## 2020-03-29 DIAGNOSIS — G7000 Myasthenia gravis without (acute) exacerbation: Secondary | ICD-10-CM | POA: Diagnosis not present

## 2020-03-29 NOTE — Telephone Encounter (Signed)
Would you mind calling patient and making an in-office appointment at the end of October or around there please? Thanks!

## 2020-04-09 ENCOUNTER — Other Ambulatory Visit: Payer: Self-pay | Admitting: Neurology

## 2020-04-13 ENCOUNTER — Ambulatory Visit: Payer: BC Managed Care – PPO | Admitting: Family Medicine

## 2020-04-13 ENCOUNTER — Encounter: Payer: Self-pay | Admitting: Family Medicine

## 2020-04-13 ENCOUNTER — Other Ambulatory Visit: Payer: Self-pay

## 2020-04-13 VITALS — BP 152/78 | HR 58 | Temp 97.9°F | Ht 68.0 in | Wt 205.8 lb

## 2020-04-13 DIAGNOSIS — E1159 Type 2 diabetes mellitus with other circulatory complications: Secondary | ICD-10-CM | POA: Diagnosis not present

## 2020-04-13 DIAGNOSIS — I10 Essential (primary) hypertension: Secondary | ICD-10-CM

## 2020-04-13 DIAGNOSIS — E099 Drug or chemical induced diabetes mellitus without complications: Secondary | ICD-10-CM

## 2020-04-13 DIAGNOSIS — E785 Hyperlipidemia, unspecified: Secondary | ICD-10-CM

## 2020-04-13 DIAGNOSIS — E1169 Type 2 diabetes mellitus with other specified complication: Secondary | ICD-10-CM

## 2020-04-13 DIAGNOSIS — G7000 Myasthenia gravis without (acute) exacerbation: Secondary | ICD-10-CM | POA: Diagnosis not present

## 2020-04-13 DIAGNOSIS — T380X5A Adverse effect of glucocorticoids and synthetic analogues, initial encounter: Secondary | ICD-10-CM

## 2020-04-13 DIAGNOSIS — D367 Benign neoplasm of other specified sites: Secondary | ICD-10-CM

## 2020-04-13 DIAGNOSIS — R0989 Other specified symptoms and signs involving the circulatory and respiratory systems: Secondary | ICD-10-CM

## 2020-04-13 MED ORDER — HYDROCHLOROTHIAZIDE 25 MG PO TABS: 25.0000 mg | ORAL_TABLET | Freq: Every day | ORAL | 3 refills | Status: DC

## 2020-04-13 NOTE — Patient Instructions (Addendum)
Try drinking water before bed to see if phlegm gets better. Humidification in the room might work too  Cholesterol is high.  I want to recheck this in 3 months. Take your cholesterol medication every day!  You have an extremely high risk of stroke/ heart attack right now, almost 50%.  Hydrochlorothiazide was added to your blood pressure medications.  See the nurse in 2 weeks for recheck.   High Cholesterol  High cholesterol is a condition in which the blood has high levels of a white, waxy, fat-like substance (cholesterol). The human body needs small amounts of cholesterol. The liver makes all the cholesterol that the body needs. Extra (excess) cholesterol comes from the food that we eat. Cholesterol is carried from the liver by the blood through the blood vessels. If you have high cholesterol, deposits (plaques) may build up on the walls of your blood vessels (arteries). Plaques make the arteries narrower and stiffer. Cholesterol plaques increase your risk for heart attack and stroke. Work with your health care provider to keep your cholesterol levels in a healthy range. What increases the risk? This condition is more likely to develop in people who:  Eat foods that are high in animal fat (saturated fat) or cholesterol.  Are overweight.  Are not getting enough exercise.  Have a family history of high cholesterol. What are the signs or symptoms? There are no symptoms of this condition. How is this diagnosed? This condition may be diagnosed from the results of a blood test.  If you are older than age 75, your health care provider may check your cholesterol every 4-6 years.  You may be checked more often if you already have high cholesterol or other risk factors for heart disease. The blood test for cholesterol measures:  "Bad" cholesterol (LDL cholesterol). This is the main type of cholesterol that causes heart disease. The desired level for LDL is less than 100.  "Good"  cholesterol (HDL cholesterol). This type helps to protect against heart disease by cleaning the arteries and carrying the LDL away. The desired level for HDL is 60 or higher.  Triglycerides. These are fats that the body can store or burn for energy. The desired number for triglycerides is lower than 150.  Total cholesterol. This is a measure of the total amount of cholesterol in your blood, including LDL cholesterol, HDL cholesterol, and triglycerides. A healthy number is less than 200. How is this treated? This condition is treated with diet changes, lifestyle changes, and medicines. Diet changes  This may include eating more whole grains, fruits, vegetables, nuts, and fish.  This may also include cutting back on red meat and foods that have a lot of added sugar. Lifestyle changes  Changes may include getting at least 40 minutes of aerobic exercise 3 times a week. Aerobic exercises include walking, biking, and swimming. Aerobic exercise along with a healthy diet can help you maintain a healthy weight.  Changes may also include quitting smoking. Medicines  Medicines are usually given if diet and lifestyle changes have failed to reduce your cholesterol to healthy levels.  Your health care provider may prescribe a statin medicine. Statin medicines have been shown to reduce cholesterol, which can reduce the risk of heart disease. Follow these instructions at home: Eating and drinking If told by your health care provider:  Eat chicken (without skin), fish, veal, shellfish, ground [place] breast, and round or loin cuts of red meat.  Do not eat fried foods or fatty meats, such as hot  dogs and salami.  Eat plenty of fruits, such as apples.  Eat plenty of vegetables, such as broccoli, potatoes, and carrots.  Eat beans, peas, and lentils.  Eat grains such as barley, rice, couscous, and bulgur wheat.  Eat pasta without cream sauces.  Use skim or nonfat milk, and eat low-fat or nonfat  yogurt and cheeses.  Do not eat or drink whole milk, cream, ice cream, egg yolks, or hard cheeses.  Do not eat stick margarine or tub margarines that contain trans fats (also called partially hydrogenated oils).  Do not eat saturated tropical oils, such as coconut oil and palm oil.  Do not eat cakes, cookies, crackers, or other baked goods that contain trans fats.  General instructions  Exercise as directed by your health care provider. Increase your activity level with activities such as gardening, walking, and taking the stairs.  Take over-the-counter and prescription medicines only as told by your health care provider.  Do not use any products that contain nicotine or tobacco, such as cigarettes and e-cigarettes. If you need help quitting, ask your health care provider.  Keep all follow-up visits as told by your health care provider. This is important. Contact a health care provider if:  You are struggling to maintain a healthy diet or weight.  You need help to start on an exercise program.  You need help to stop smoking. Get help right away if:  You have chest pain.  You have trouble breathing. This information is not intended to replace advice given to you by your health care provider. Make sure you discuss any questions you have with your health care provider. Document Revised: 09/11/2017 Document Reviewed: 03/08/2016 Elsevier Patient Education  [place].

## 2020-04-13 NOTE — Progress Notes (Signed)
Subjective: CC: drug induced DM PCP: [name], DO IOX:BDZH Kenneth Fields is a 59 y.o. male presenting to clinic today for:  1. T2DM w/ HTN and HLD; myasthenia gravis Type 2 diabetes thought to be secondary to therapeutic corticosteroid use.    He is now a diet-controlled diabetic.  He takes Crestor 20 mg 5 out of 7 days/week.  He is compliant with his benazepril 40 mg, Cardizem 120 mg daily.    Last eye exam: UTD Last foot exam: UTD Last A1c:  Lab Results  Component Value Date   HGBA1C 6.5 03/09/2020   Nephropathy screen indicated?: on ACE-I Last flu, zoster and/or pneumovax:  Immunization History  Administered Date(s) Administered  . Pneumococcal Polysaccharide-23 07/23/2014  . Tdap 09/22/2009, 02/14/2013    ROS: No chest pain, shortness of breath, lower extremity edema, visual disturbance  2.  GERD Patient reports that the pantoprazole does a great job of controlling his acid reflux.  He has noticed some phlegm production in the morning that he wakes up with and is easily cleared and does not recur throughout the day.  However, he thinks this might be associated with the fact that he drinks milk before bedtime.  He takes Zyrtec every night and does not necessarily feel like he is draining.  3. Cyst Wanted me to take a look at a spot on his arm.  It has been there for years. No pain, draining.  No real change in size.   ROS: Per HPI  Allergies  Allergen Reactions  . Chantix [Varenicline Tartrate]     "mental problems"   Past Medical History:  Diagnosis Date  . High cholesterol   . Hypertension   . Myasthenia gravis ([place])   . Myasthenia gravis ([place])     Current Outpatient Medications:  .  aspirin EC 81 MG tablet, Take 81 mg by mouth daily., Disp: , Rfl:  .  benazepril (LOTENSIN) 40 MG tablet, Take 1 tablet (40 mg total) by mouth daily. Please disregard 20mg  tablets., Disp: 90 tablet, Rfl: 3 .  cetirizine (ZYRTEC) 10 MG tablet, Take 10 mg by mouth daily as  needed for allergies., Disp: , Rfl:  .  diltiazem (CARDIZEM CD) 120 MG 24 hr capsule, Take 1 capsule (120 mg total) by mouth daily., Disp: 90 capsule, Rfl: 3 .  mycophenolate (CELLCEPT) 500 MG tablet, Take 2 tablets (1,000 mg total) by mouth 2 (two) times daily., Disp: 360 tablet, Rfl: 3 .  pantoprazole (PROTONIX) 40 MG tablet, Take 1 tablet (40 mg total) by mouth daily., Disp: 90 tablet, Rfl: 3 .  pyridostigmine (MESTINON) 60 MG tablet, TAKE 0.5-1 TABLETS (30-60 MG TOTAL) BY MOUTH 3 (THREE) TIMES DAILY AS NEEDED., Disp: 90 tablet, Rfl: 0 .  rosuvastatin (CRESTOR) 20 MG tablet, Take 1 tablet (20 mg total) by mouth daily., Disp: 90 tablet, Rfl: 3 Social History   Socioeconomic History  . Marital status: Married    Spouse name: Not on file  . Number of children: Not on file  . Years of education: Not on file  . Highest education level: 11th grade  Occupational History  . Not on file  Tobacco Use  . Smoking status: Current Every Day Smoker    Packs/day: 1.00    Years: 40.00    Pack years: 40.00    Types: Cigarettes  . Smokeless tobacco: Never Used  Vaping Use  . Vaping Use: Never used  Substance and Sexual Activity  . Alcohol use: Yes  Alcohol/week: 14.0 - 21.0 standard drinks    Types: 14 - 21 Cans of beer per week    Comment: 3 beers per night  . Drug use: Never  . Sexual activity: Not on file  Other Topics Concern  . Not on file  Social History Narrative   Live at home with his wife   Right handed   Caffeine: 1 diet mtn dew daily, occasional tea (decaf), 1 cup of coffee in the mornings    Social Determinants of Health   Financial Resource Strain:   . Difficulty of Paying Living Expenses:   Food Insecurity:   . Worried About Charity fundraiser in the Last Year:   . Arboriculturist in the Last Year:   Transportation Needs:   . Film/video editor (Medical):   Marland Kitchen Lack of Transportation (Non-Medical):   Physical Activity:   . Days of Exercise per Week:   . Minutes  of Exercise per Session:   Stress:   . Feeling of Stress :   Social Connections:   . Frequency of Communication with Friends and Family:   . Frequency of Social Gatherings with Friends and Family:   . Attends Religious Services:   . Active Member of Clubs or Organizations:   . Attends Archivist Meetings:   Marland Kitchen Marital Status:   Intimate Partner Violence:   . Fear of Current or Ex-Partner:   . Emotionally Abused:   Marland Kitchen Physically Abused:   . Sexually Abused:    Family History  Problem Relation Age of Onset  . Lung cancer Mother   . Cancer Mother     Objective: Office vital signs reviewed. BP (!) 152/78   Pulse 58   Temp 97.9 F (36.6 C)   Ht 5\' 8"  (1.727 m)   Wt (!) 205 lb 12.8 oz (93.4 kg)   SpO2 98%   BMI 31.29 kg/m   Physical Examination:  General: Awake, alert, well nourished, No acute distress HEENT: Normal, sclera white, MMM Cardio: regular rate and rhythm, S1S2 heard, no murmurs appreciated Pulm: clear to auscultation bilaterally, No rhonchi or rales; normal work of breathing on room air Extremities: Warm, well perfused.  No edema. Skin: grape sized cyst, well circumscribed and mobile under the right arm. Nontender. No central punctum.  The 10-year ASCVD risk score Mikey Bussing DC Brooke Bonito., et al., 2013) is: 40.2%   Values used to calculate the score:     Age: 19 years     Sex: Male     Is Non-Hispanic African American: No     Diabetic: Yes     Tobacco smoker: Yes     Systolic Blood Pressure: 086 mmHg     Is BP treated: Yes     HDL Cholesterol: 39 mg/dL     Total Cholesterol: 208 mg/dL   Assessment/ Plan: 59 y.o. male   1. 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 (HCC) Controlled - Bayer DCA Hb A1c Waived; Future  2. Hypertension associated with diabetes ([place] ) Not controlled. Add HCTZ, Recheck in 2 weeks - hydrochlorothiazide (HYDRODIURIL) 25 MG tablet; Take 1 tablet (25 mg total) by mouth daily.  Dispense: 90 tablet; Refill: 3  3.  Hyperlipidemia associated with type 2 diabetes mellitus (HCC) ASCVD risk >40%.  Compliance encouraged.  Recheck in 3 months.  If still not controlled, plan for Zetia - Lipid panel; Future  4. Myasthenia gravis ([place]) Stable.   5. Phlegm in throat Increase water intake.  6.  Cyst, dermoid, arm, right Asymptomatic.   No orders of the defined types were placed in this encounter.  No orders of the defined types were placed in this encounter.    [name], DO [place] [phone]

## 2020-04-27 ENCOUNTER — Ambulatory Visit: Payer: BC Managed Care – PPO | Admitting: *Deleted

## 2020-04-27 ENCOUNTER — Other Ambulatory Visit: Payer: Self-pay

## 2020-04-27 DIAGNOSIS — I152 Hypertension secondary to endocrine disorders: Secondary | ICD-10-CM

## 2020-04-27 NOTE — Progress Notes (Signed)
Bp 138/80  HR 68 Has not been checking at home as he does not have a way. He does not complain of any S/S of elevated Bp today.  He has been taking his meds regularly since his OV with Dr Lajuana Ripple.

## 2020-07-16 ENCOUNTER — Encounter: Payer: Self-pay | Admitting: Family Medicine

## 2020-07-16 ENCOUNTER — Ambulatory Visit: Payer: BC Managed Care – PPO | Admitting: Neurology

## 2020-07-16 ENCOUNTER — Other Ambulatory Visit: Payer: Self-pay

## 2020-07-16 ENCOUNTER — Encounter: Payer: Self-pay | Admitting: Neurology

## 2020-07-16 ENCOUNTER — Ambulatory Visit: Payer: BC Managed Care – PPO | Admitting: Family Medicine

## 2020-07-16 VITALS — BP 132/76 | HR 74 | Temp 98.2°F | Ht 68.0 in | Wt 203.0 lb

## 2020-07-16 VITALS — BP 157/92 | HR 67 | Ht 68.0 in | Wt 203.0 lb

## 2020-07-16 DIAGNOSIS — G7000 Myasthenia gravis without (acute) exacerbation: Secondary | ICD-10-CM

## 2020-07-16 DIAGNOSIS — G4719 Other hypersomnia: Secondary | ICD-10-CM

## 2020-07-16 DIAGNOSIS — Z79899 Other long term (current) drug therapy: Secondary | ICD-10-CM

## 2020-07-16 DIAGNOSIS — E099 Drug or chemical induced diabetes mellitus without complications: Secondary | ICD-10-CM | POA: Diagnosis not present

## 2020-07-16 DIAGNOSIS — E1159 Type 2 diabetes mellitus with other circulatory complications: Secondary | ICD-10-CM | POA: Diagnosis not present

## 2020-07-16 DIAGNOSIS — I152 Hypertension secondary to endocrine disorders: Secondary | ICD-10-CM

## 2020-07-16 DIAGNOSIS — R0683 Snoring: Secondary | ICD-10-CM

## 2020-07-16 DIAGNOSIS — E785 Hyperlipidemia, unspecified: Secondary | ICD-10-CM

## 2020-07-16 DIAGNOSIS — R5383 Other fatigue: Secondary | ICD-10-CM

## 2020-07-16 DIAGNOSIS — E1169 Type 2 diabetes mellitus with other specified complication: Secondary | ICD-10-CM | POA: Diagnosis not present

## 2020-07-16 DIAGNOSIS — T380X5A Adverse effect of glucocorticoids and synthetic analogues, initial encounter: Secondary | ICD-10-CM

## 2020-07-16 DIAGNOSIS — G479 Sleep disorder, unspecified: Secondary | ICD-10-CM

## 2020-07-16 MED ORDER — PYRIDOSTIGMINE BROMIDE 60 MG PO TABS
30.0000 mg | ORAL_TABLET | Freq: Three times a day (TID) | ORAL | 4 refills | Status: DC | PRN
Start: 2020-07-16 — End: 2021-08-27

## 2020-07-16 MED ORDER — MYCOPHENOLATE MOFETIL 500 MG PO TABS: 1000.0000 mg | ORAL_TABLET | Freq: Two times a day (BID) | ORAL | 3 refills | Status: DC

## 2020-07-16 NOTE — Patient Instructions (Signed)
Blood work Sleep test   Sleep Apnea Sleep apnea is a condition in which breathing pauses or becomes shallow during sleep. Episodes of sleep apnea usually last 10 seconds or longer, and they may occur as many as 20 times an hour. Sleep apnea disrupts your sleep and keeps your body from getting the rest that it needs. This condition can increase your risk of certain health problems, including:  Heart attack.  Stroke.  Obesity.  Diabetes.  Heart failure.  Irregular heartbeat. What are the causes? There are three kinds of sleep apnea:  Obstructive sleep apnea. This kind is caused by a blocked or collapsed airway.  Central sleep apnea. This kind happens when the part of the brain that controls breathing does not send the correct signals to the muscles that control breathing.  Mixed sleep apnea. This is a combination of obstructive and central sleep apnea. The most common cause of this condition is a collapsed or blocked airway. An airway can collapse or become blocked if:  Your throat muscles are abnormally relaxed.  Your tongue and tonsils are larger than normal.  You are overweight.  Your airway is smaller than normal. What increases the risk? You are more likely to develop this condition if you:  Are overweight.  Smoke.  Have a smaller than normal airway.  Are elderly.  Are male.  Drink alcohol.  Take sedatives or tranquilizers.  Have a family history of sleep apnea. What are the signs or symptoms? Symptoms of this condition include:  Trouble staying asleep.  Daytime sleepiness and tiredness.  Irritability.  Loud snoring.  Morning headaches.  Trouble concentrating.  Forgetfulness.  Decreased interest in sex.  Unexplained sleepiness.  Mood swings.  Personality changes.  Feelings of depression.  Waking up often during the night to urinate.  Dry mouth.  Sore throat. How is this diagnosed? This condition may be diagnosed with:  A  medical history.  A physical exam.  A series of tests that are done while you are sleeping (sleep study). These tests are usually done in a sleep lab, but they may also be done at home. How is this treated? Treatment for this condition aims to restore normal breathing and to ease symptoms during sleep. It may involve managing health issues that can affect breathing, such as high blood pressure or obesity. Treatment may include:  Sleeping on your side.  Using a decongestant if you have nasal congestion.  Avoiding the use of depressants, including alcohol, sedatives, and narcotics.  Losing weight if you are overweight.  Making changes to your diet.  Quitting smoking.  Using a device to open your airway while you sleep, such as: ? An oral appliance. This is a custom-made mouthpiece that shifts your lower jaw forward. ? A continuous positive airway pressure (CPAP) device. This device blows air through a mask when you breathe out (exhale). ? A nasal expiratory positive airway pressure (EPAP) device. This device has valves that you put into each nostril. ? A bi-level positive airway pressure (BPAP) device. This device blows air through a mask when you breathe in (inhale) and breathe out (exhale).  Having surgery if other treatments do not work. During surgery, excess tissue is removed to create a wider airway. It is important to get treatment for sleep apnea. Without treatment, this condition can lead to:  High blood pressure.  Coronary artery disease.  In men, an inability to achieve or maintain an erection (impotence).  Reduced thinking abilities. Follow these instructions at home:  Lifestyle  Make any lifestyle changes that your health care provider recommends.  Eat a healthy, well-balanced diet.  Take steps to lose weight if you are overweight.  Avoid using depressants, including alcohol, sedatives, and narcotics.  Do not use any products that contain nicotine or tobacco,  such as cigarettes, e-cigarettes, and chewing tobacco. If you need help quitting, ask your health care provider. General instructions  Take over-the-counter and prescription medicines only as told by your health care provider.  If you were given a device to open your airway while you sleep, use it only as told by your health care provider.  If you are having surgery, make sure to tell your health care provider you have sleep apnea. You may need to bring your device with you.  Keep all follow-up visits as told by your health care provider. This is important. Contact a health care provider if:  The device that you received to open your airway during sleep is uncomfortable or does not seem to be working.  Your symptoms do not improve.  Your symptoms get worse. Get help right away if:  You develop: ? Chest pain. ? Shortness of breath. ? Discomfort in your back, arms, or stomach.  You have: ? Trouble speaking. ? Weakness on one side of your body. ? Drooping in your face. These symptoms may represent a serious problem that is an emergency. Do not wait to see if the symptoms will go away. Get medical help right away. Call your local emergency services (911 in the [place] Do not drive yourself to the hospital. Summary  Sleep apnea is a condition in which breathing pauses or becomes shallow during sleep.  The most common cause is a collapsed or blocked airway.  The goal of treatment is to restore normal breathing and to ease symptoms during sleep. This information is not intended to replace advice given to you by your health care provider. Make sure you discuss any questions you have with your health care provider. Document Revised: 02/23/2019 Document Reviewed: 05/04/2018 Elsevier Patient Education  [place].

## 2020-07-16 NOTE — Progress Notes (Signed)
Subjective: CC: drug induced DM, fatigue PCP: [name], DO OEV:OJJK Kenneth Fields is a 59 y.o. male presenting to clinic today for:  1. T2DM w/ HTN and HLD; myasthenia gravis; fatigue Type 2 diabetes thought to be secondary to therapeutic corticosteroid use.    Continues to take benazepril 40 mg daily, Cardizem 120 mg daily, hydrochlorothiazide 25 mg daily and Crestor. Overall he has been feeling well. He has had diet-controlled diabetes. He had his A1c drawn this morning at his neurologist office as well as his cholesterol panel. He denies any chest pain, shortness of breath, dizziness, visual disturbance. He does report easy fatigability despite adequate hours of sleep. He has been told by his wife that he snores loudly and therefore they sleep in separate bedrooms. He denies any gasping or choking at nighttime. He does report nighttime awakenings around 3 AM. His neurologist recommended sleep testing and he is asking for referral for this today.  Last eye exam: UTD Last foot exam: UTD Last A1c:  Lab Results  Component Value Date   HGBA1C 6.5 03/09/2020   Nephropathy screen indicated?: on ACE-I Last flu, zoster and/or pneumovax:  Immunization History  Administered Date(s) Administered  . Janssen (J&J) SARS-COV-2 Vaccination 01/23/2020  . Pneumococcal Polysaccharide-23 07/23/2014  . Tdap 09/22/2009, 02/14/2013   ROS: Per HPI  Allergies  Allergen Reactions  . Chantix [Varenicline Tartrate]     "mental problems"   Past Medical History:  Diagnosis Date  . High cholesterol   . Hypertension   . Myasthenia gravis ([place])   . Myasthenia gravis ([place])     Current Outpatient Medications:  .  aspirin EC 81 MG tablet, Take 81 mg by mouth daily., Disp: , Rfl:  .  benazepril (LOTENSIN) 40 MG tablet, Take 1 tablet (40 mg total) by mouth daily. Please disregard 20mg  tablets., Disp: 90 tablet, Rfl: 3 .  cetirizine (ZYRTEC) 10 MG tablet, Take 10 mg by mouth daily as needed for  allergies., Disp: , Rfl:  .  diltiazem (CARDIZEM CD) 120 MG 24 hr capsule, Take 1 capsule (120 mg total) by mouth daily., Disp: 90 capsule, Rfl: 3 .  fluticasone (FLONASE) 50 MCG/ACT nasal spray, Place into both nostrils as needed for allergies or rhinitis., Disp: , Rfl:  .  hydrochlorothiazide (HYDRODIURIL) 25 MG tablet, Take 1 tablet (25 mg total) by mouth daily., Disp: 90 tablet, Rfl: 3 .  mycophenolate (CELLCEPT) 500 MG tablet, Take 2 tablets (1,000 mg total) by mouth 2 (two) times daily., Disp: 360 tablet, Rfl: 3 .  pantoprazole (PROTONIX) 40 MG tablet, Take 1 tablet (40 mg total) by mouth daily., Disp: 90 tablet, Rfl: 3 .  pyridostigmine (MESTINON) 60 MG tablet, Take 0.5-1 tablets (30-60 mg total) by mouth 3 (three) times daily as needed., Disp: 270 tablet, Rfl: 4 .  rosuvastatin (CRESTOR) 20 MG tablet, Take 1 tablet (20 mg total) by mouth daily., Disp: 90 tablet, Rfl: 3 Social History   Socioeconomic History  . Marital status: Married    Spouse name: Not on file  . Number of children: Not on file  . Years of education: Not on file  . Highest education level: 11th grade  Occupational History  . Not on file  Tobacco Use  . Smoking status: Current Every Day Smoker    Packs/day: 1.00    Years: 40.00    Pack years: 40.00    Types: Cigarettes  . Smokeless tobacco: Never Used  Vaping Use  . Vaping Use: Never used  Substance and Sexual Activity  . Alcohol use: Yes    Alcohol/week: 14.0 - 21.0 standard drinks    Types: 14 - 21 Cans of beer per week    Comment: 3 beers per night  . Drug use: Never  . Sexual activity: Not on file  Other Topics Concern  . Not on file  Social History Narrative   Live at home with his wife   Right handed   Caffeine: 1 diet mtn dew daily, occasional tea (decaf), 1 cup of coffee in the mornings (no coffee when it's warm outside)   Social Determinants of Health   Financial Resource Strain:   . Difficulty of Paying Living Expenses: Not on file  Food  Insecurity:   . Worried About Charity fundraiser in the Last Year: Not on file  . Ran Out of Food in the Last Year: Not on file  Transportation Needs:   . Lack of Transportation (Medical): Not on file  . Lack of Transportation (Non-Medical): Not on file  Physical Activity:   . Days of Exercise per Week: Not on file  . Minutes of Exercise per Session: Not on file  Stress:   . Feeling of Stress : Not on file  Social Connections:   . Frequency of Communication with Friends and Family: Not on file  . Frequency of Social Gatherings with Friends and Family: Not on file  . Attends Religious Services: Not on file  . Active Member of Clubs or Organizations: Not on file  . Attends Archivist Meetings: Not on file  . Marital Status: Not on file  Intimate Partner Violence:   . Fear of Current or Ex-Partner: Not on file  . Emotionally Abused: Not on file  . Physically Abused: Not on file  . Sexually Abused: Not on file   Family History  Problem Relation Age of Onset  . Lung cancer Mother   . Cancer Mother     Objective: Office vital signs reviewed. BP 132/76   Pulse 74   Temp 98.2 F (36.8 C)   Ht 5\' 8"  (1.727 m)   Wt 203 lb (92.1 kg)   SpO2 95%   BMI 30.87 kg/m   Physical Examination:  General: Awake, alert, well nourished, No acute distress HEENT: Normal, sclera white, MMM Cardio: regular rate and rhythm, S1S2 heard, no murmurs appreciated Pulm: clear to auscultation bilaterally, No rhonchi or rales; normal work of breathing on room air Neuro: No focal neurologic deficits Psych: Mood stable, speech normal, affect appropriate, pleasant and interactive  Assessment/ Plan: 59 y.o. male   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 (HCC)  Hypertension associated with diabetes ([place])  Hyperlipidemia associated with type 2 diabetes mellitus (HCC)  Myasthenia gravis ([place])  Sleep disorder - Plan: Ambulatory referral to Neurology  A1c was collected  along with his lipid and CMP at his neurologist office this morning. Will await for their recommendations/results. In the interim, his fatigue is suspicious for an diagnosed sleep apnea. I placed a referral back to the neurologist office to the sleep specialist there. We discussed that if he has ongoing excessive fatigue during the day despite interventions, we could consider testing for early morning testosterone. He will follow-up with me in 6 months, sooner if needed  Orders Placed This Encounter  Procedures  . Ambulatory referral to Neurology    Referral Priority:   Routine    Referral Type:   Consultation    Referral Reason:   Specialty  Services Required    Requested Specialty:   Neurology    Number of Visits Requested:   1   No orders of the defined types were placed in this encounter.    [name], DO [place] [phone]
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
## 2020-07-17 LAB — COMPREHENSIVE METABOLIC PANEL
ALT: 29 IU/L (ref 0–44)
AST: 24 IU/L (ref 0–40)
Albumin/Globulin Ratio: 2.2 (ref 1.2–2.2)
Albumin: 4.6 g/dL (ref 3.8–4.9)
Alkaline Phosphatase: 87 IU/L (ref 44–121)
BUN/Creatinine Ratio: 16 (ref 9–20)
BUN: 13 mg/dL (ref 6–24)
Bilirubin Total: 0.4 mg/dL (ref 0.0–1.2)
CO2: 25 mmol/L (ref 20–29)
Calcium: 9.8 mg/dL (ref 8.7–10.2)
Chloride: 99 mmol/L (ref 96–106)
Creatinine, Ser: 0.82 mg/dL (ref 0.76–1.27)
GFR calc Af Amer: 112 mL/min/{1.73_m2} (ref >59)
GFR calc non Af Amer: 97 mL/min/{1.73_m2} (ref >59)
Globulin, Total: 2.1 g/dL (ref 1.5–4.5)
Glucose: 118 mg/dL — ABNORMAL HIGH (ref 65–99)
Potassium: 4.5 mmol/L (ref 3.5–5.2)
Sodium: 139 mmol/L (ref 134–144)
Total Protein: 6.7 g/dL (ref 6.0–8.5)

## 2020-07-17 LAB — LIPID PANEL
Chol/HDL Ratio: 4.4 ratio (ref 0.0–5.0)
Cholesterol, Total: 190 mg/dL (ref 100–199)
HDL: 43 mg/dL (ref >39)
LDL Chol Calc (NIH): 74 mg/dL (ref 0–99)
Triglycerides: 467 mg/dL — ABNORMAL HIGH (ref 0–149)
VLDL Cholesterol Cal: 73 mg/dL — ABNORMAL HIGH (ref 5–40)

## 2020-07-17 LAB — T4, FREE: Free T4: 1.04 ng/dL (ref 0.82–1.77)

## 2020-07-17 LAB — CBC WITH DIFFERENTIAL/PLATELET
Basophils Absolute: 0.1 10*3/uL (ref 0.0–0.2)
Basos: 1 %
EOS (ABSOLUTE): 0.2 10*3/uL (ref 0.0–0.4)
Eos: 2 %
Hematocrit: 48 % (ref 37.5–51.0)
Hemoglobin: 16.4 g/dL (ref 13.0–17.7)
Immature Grans (Abs): 0 10*3/uL (ref 0.0–0.1)
Immature Granulocytes: 0 %
Lymphocytes Absolute: 1.9 10*3/uL (ref 0.7–3.1)
Lymphs: 24 %
MCH: 31.4 pg (ref 26.6–33.0)
MCHC: 34.2 g/dL (ref 31.5–35.7)
MCV: 92 fL (ref 79–97)
Monocytes Absolute: 0.5 10*3/uL (ref 0.1–0.9)
Monocytes: 7 %
Neutrophils Absolute: 5.2 10*3/uL (ref 1.4–7.0)
Neutrophils: 66 %
Platelets: 260 10*3/uL (ref 150–450)
RBC: 5.23 x10E6/uL (ref 4.14–5.80)
RDW: 13.1 % (ref 11.6–15.4)
WBC: 7.8 10*3/uL (ref 3.4–10.8)

## 2020-07-17 LAB — HEMOGLOBIN A1C
Est. average glucose Bld gHb Est-mCnc: 143 mg/dL
Hgb A1c MFr Bld: 6.6 % — ABNORMAL HIGH (ref 4.8–5.6)

## 2020-07-17 LAB — TSH: TSH: 0.636 u[IU]/mL (ref 0.450–4.500)

## 2020-07-23 ENCOUNTER — Other Ambulatory Visit: Payer: Self-pay | Admitting: Family Medicine

## 2020-07-23 MED ORDER — ROSUVASTATIN CALCIUM 40 MG PO TABS
40.0000 mg | ORAL_TABLET | Freq: Every day | ORAL | 3 refills | Status: DC
Start: 2020-07-23 — End: 2021-08-09

## 2020-08-21 ENCOUNTER — Other Ambulatory Visit: Payer: Self-pay

## 2020-08-21 ENCOUNTER — Ambulatory Visit: Payer: BC Managed Care – PPO | Admitting: Neurology

## 2020-08-21 ENCOUNTER — Encounter: Payer: Self-pay | Admitting: Neurology

## 2020-08-21 VITALS — BP 142/82 | HR 72 | Ht 68.0 in | Wt 205.2 lb

## 2020-08-21 DIAGNOSIS — Z79899 Other long term (current) drug therapy: Secondary | ICD-10-CM | POA: Insufficient documentation

## 2020-08-21 DIAGNOSIS — G7000 Myasthenia gravis without (acute) exacerbation: Secondary | ICD-10-CM

## 2020-08-21 DIAGNOSIS — G4719 Other hypersomnia: Secondary | ICD-10-CM | POA: Diagnosis not present

## 2020-08-21 DIAGNOSIS — R0683 Snoring: Secondary | ICD-10-CM

## 2020-08-21 DIAGNOSIS — F17210 Nicotine dependence, cigarettes, uncomplicated: Secondary | ICD-10-CM
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
## 2020-08-22 ENCOUNTER — Telehealth: Payer: Self-pay

## 2020-08-22 DIAGNOSIS — U071 COVID-19: Secondary | ICD-10-CM

## 2020-08-22 HISTORY — DX: COVID-19: U07.1

## 2020-08-27 NOTE — Telephone Encounter (Signed)
Pt has changed his mind and would like to do the infusion

## 2020-08-27 NOTE — Telephone Encounter (Signed)
Spoke with patient and he did not leave all the information with them that they needed to scheduled. Pt is going to call back and give them all the information. Patient will call us back if needs anything.

## 2020-08-27 NOTE — Telephone Encounter (Signed)
Spoke to pt and his symptoms are getting better.  MAB was offered but he wishes to hold of on MAB.

## 2020-08-27 NOTE — Telephone Encounter (Signed)
Given infusion number.

## 2020-08-28 ENCOUNTER — Telehealth: Payer: Self-pay | Admitting: Unknown Physician Specialty

## 2020-08-28 DIAGNOSIS — Z23 Encounter for immunization: Secondary | ICD-10-CM | POA: Diagnosis not present

## 2020-08-28 DIAGNOSIS — U071 COVID-19: Secondary | ICD-10-CM | POA: Diagnosis not present

## 2020-08-28 NOTE — Telephone Encounter (Signed)
Called to discuss with Delma Officer about Covid symptoms and the use of  monoclonal antibody infusion for those with mild to moderate Covid symptoms and at a high risk of hospitalization.     Pt states he has an appt elsewhere for infusion

## 2020-09-04 DIAGNOSIS — R059 Cough, unspecified: Secondary | ICD-10-CM | POA: Diagnosis not present

## 2020-09-04 DIAGNOSIS — U071 COVID-19: Secondary | ICD-10-CM | POA: Diagnosis not present

## 2020-09-11 ENCOUNTER — Ambulatory Visit (INDEPENDENT_AMBULATORY_CARE_PROVIDER_SITE_OTHER): Payer: BC Managed Care – PPO | Admitting: Neurology

## 2020-09-11 DIAGNOSIS — G4719 Other hypersomnia: Secondary | ICD-10-CM

## 2020-09-11 DIAGNOSIS — G4733 Obstructive sleep apnea (adult) (pediatric): Secondary | ICD-10-CM

## 2020-09-11 DIAGNOSIS — G7000 Myasthenia gravis without (acute) exacerbation: Secondary | ICD-10-CM

## 2020-09-11 DIAGNOSIS — R0683 Snoring: Secondary | ICD-10-CM

## 2020-09-11 DIAGNOSIS — Z79899 Other long term (current) drug therapy: Secondary | ICD-10-CM

## 2020-09-18 DIAGNOSIS — Z6831 Body mass index (BMI) 31.0-31.9, adult: Secondary | ICD-10-CM | POA: Diagnosis not present

## 2020-09-18 DIAGNOSIS — S46811A Strain of other muscles, fascia and tendons at shoulder and upper arm level, right arm, initial encounter: Secondary | ICD-10-CM | POA: Diagnosis not present

## 2020-09-18 DIAGNOSIS — M25562 Pain in left knee: Secondary | ICD-10-CM | POA: Diagnosis not present

## 2020-09-19 NOTE — Progress Notes (Signed)
   [facility]  HOME SLEEP TEST (Watch PAT)  STUDY DATE: 09/11/20- loaded 09-19-2020  DOB: 1961-02-22  MRN: [id number]  ORDERING CLINICIAN: Larey Seat, MD   REFERRING CLINICIAN: Dr. Jaynee Eagles, MD   CLINICAL INFORMATION/HISTORY: Kenneth Fields a 59 -year-old Caucasian male patientand was seen on 08/21/2020 upon request from [name], Chiefconcern: "I am sleepy, tired a lot in PM- full time employed at [employer]- the former [employer] hospital "". 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   Kenneth Fields has a medical history of High cholesterol, Hypertension, GERD and Myasthenia gravis ([place]) diagnosed in 2018. He is reportedly a loud snorer and sleeps in a different room from his spouse.  Sleeprelevant medical history: Myasthenia,Tonsillectomy, steroid induced HTN and steroid induced DM.  Familymedical /sleep history:mother was a sleep walker, even sleepdriving. MGM used to scream in her sleep.  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   Epworth sleepiness score: 12/24.  BMI: 31.1 kg/m  Neck Circumference: 17 "  FINDINGS:   Total Record Time (hours, min): 7 h 24 min  Total Sleep Time (hours, min):  6 h 8 min   Percent REM (%):    28.90 %   Calculated pAHI (per hour):  17.6      REM pAHI:    36.1/h    NREM pAHI: 10.1  Supine AHI: N/A   Oxygen Saturation (%) Mean: 92  Minimum oxygen saturation (%):        86   O2 Saturation Range (%): 86-98  O2Saturation (minutes) <=88%: 1.6 min   Pulse Mean (bpm):    60  Pulse Range (49-81)   IMPRESSION: This HST confirms the presence of moderate OSA (obstructive sleep apnea) with REM dependency, as well as loud snoring- but no hypoxemia.    RECOMMENDATION:  I would choose CPAP as the first treatment choice, auto CPAP at 5-15 cm water, with mask of choice and heated humidification. Mask  to be fitted in reclined position.  If the patient feels that he could not tolerate or want CPAP, I will consider Inspire device as an alternative.   INTERPRETING PHYSICIAN:  Larey Seat, MD  [name] and [facility] Sleep Board certified by The [employer] of Sleep Medicine and Diplomate of the [employer] of Sleep Medicine. Board certified In Neurology through the [place], Fellow of the [employer] of Neurology. Medical Director of [employer].

## 2020-09-25 ENCOUNTER — Telehealth: Payer: Self-pay | Admitting: Neurology

## 2020-09-25 NOTE — Procedures (Signed)
[facility]  HOME SLEEP TEST (Watch PAT)  STUDY DATE: 09/11/20- loaded 09-19-2020  DOB: 27-Jun-1961  MRN: [id number]  ORDERING CLINICIAN: Melvyn Novas, MD   REFERRING CLINICIAN: Dr. Lucia Gaskins, MD   CLINICAL INFORMATION/HISTORY: Kenneth Vogan Codyis a 60 -year-old Caucasian male patientand was seen on 08/21/2020 upon request from Dr Lucia Gaskins, Chiefconcern: "I am sleepy, tired a lot in PM- full time employed at [employer]- the former [employer] hospital "". 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   DRAKEN FARRIOR has a medical history of High cholesterol, Hypertension, GERD and Myasthenia gravis ([facility]) diagnosed in 2018. He is reportedly a loud snorer and sleeps in a different room from his spouse.  Sleeprelevant medical history: Myasthenia,Tonsillectomy, steroid induced HTN and steroid induced DM.  Familymedical /sleep history:mother was a sleep walker, even sleepdriving. MGM used to scream in her sleep.  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   Epworth sleepiness score: 12/24.  BMI: 31.1 kg/m  Neck Circumference: 17 "  FINDINGS:   Total Record Time (hours, min): 7 h 24 min  Total Sleep Time (hours, min):  6 h 8 min   Percent REM (%):    28.90 %   Calculated pAHI (per hour):  17.6      REM pAHI:    36.1/h    NREM pAHI: 10.1  Supine AHI: N/A   Oxygen Saturation (%) Mean: 92  Minimum oxygen saturation (%):        86   O2 Saturation Range (%): 86-98  O2Saturation (minutes) <=88%: 1.6 min   Pulse Mean (bpm):    60  Pulse Range (49-81)   IMPRESSION: This HST confirms the presence of moderate OSA (obstructive sleep apnea) with REM dependency, as well as loud snoring- but no hypoxemia.    RECOMMENDATION:  I would choose CPAP as the first treatment choice, auto CPAP at 5-15 cm water, with mask of choice and heated humidification. Mask to be  fitted in reclined position.  If the patient feels that he could not tolerate or want CPAP, I will consider Inspire device as an alternative.   INTERPRETING PHYSICIAN:  Melvyn Novas, MD  [name] and [facility] Sleep Board certified by The [employer] of Sleep Medicine and Diplomate of the [name] [employer] of Sleep Medicine. Board certified In Neurology through the [employer], Fellow of the [name] [employer] of Neurology. Medical Director of [employer].

## 2020-09-25 NOTE — Addendum Note (Signed)
Addended by: Melvyn Novas on: 09/25/2020 01:34 PM   Modules accepted: Orders

## 2020-09-25 NOTE — Progress Notes (Signed)
IMPRESSION: This HST confirms the presence of moderate OSA (obstructive sleep apnea) with REM dependency, as well as loud snoring- but no hypoxemia.    RECOMMENDATION:  I would choose CPAP as the first treatment choice, auto CPAP at 5-15 cm water, with mask of choice and heated humidification. Mask to be fitted in reclined position. Since this patient has Myasthenia gravis, he is less well served with a dental device.  If the patient feels that he could not tolerate or want CPAP, I will consider Inspire device as an alternative.

## 2020-09-25 NOTE — Telephone Encounter (Signed)
I called pt. I advised pt that Dr. Vickey Huger reviewed their sleep study results and found that pt has moderate sleep apnea. Dr. Vickey Huger recommends that pt starts auto CPAP. I reviewed PAP compliance expectations with the pt. Pt is agreeable to starting a CPAP. I advised pt that an order will be sent to a DME, [facility] and [facility] will call the pt within about one week after they file with the pt's insurance. [facility]) will show the pt how to use the machine, fit for masks, and troubleshoot the CPAP if needed. A follow up appt will need to be made for insurance purposes with Dr. Vickey Huger or the NP. Pt verbalized understanding to arrive 15 minutes early and bring their CPAP. A letter with all of this information in it will be sent to the pt as a reminder. Pt verbalized understanding of results. Pt had no questions at this time but was encouraged to call back if questions arise. I have sent the order to [facility]) and have received confirmation that they have received the order.

## 2020-09-25 NOTE — Telephone Encounter (Signed)
-----   Message from Melvyn Novas, MD sent at 09/25/2020  1:34 PM EST ----- IMPRESSION: This HST confirms the presence of moderate OSA (obstructive sleep apnea) with REM dependency, as well as loud snoring- but no hypoxemia.    RECOMMENDATION:  I would choose CPAP as the first treatment choice, auto CPAP at 5-15 cm water, with mask of choice and heated humidification. Mask to be fitted in reclined position. Since this patient has Myasthenia gravis, he is less well served with a dental device.  If the patient feels that he could not tolerate or want CPAP, I will consider Inspire device as an alternative.

## 2020-10-29 DIAGNOSIS — Z6831 Body mass index (BMI) 31.0-31.9, adult: Secondary | ICD-10-CM | POA: Diagnosis not present

## 2020-10-29 DIAGNOSIS — M542 Cervicalgia: Secondary | ICD-10-CM | POA: Diagnosis not present

## 2020-11-06 ENCOUNTER — Encounter: Payer: Self-pay | Admitting: *Deleted

## 2020-11-08 DIAGNOSIS — M542 Cervicalgia: Secondary | ICD-10-CM | POA: Diagnosis not present

## 2020-11-12 ENCOUNTER — Other Ambulatory Visit: Payer: Self-pay | Admitting: Neurology

## 2020-11-12 MED ORDER — GABAPENTIN 300 MG PO CAPS: ORAL_CAPSULE | ORAL | 11 refills | Status: DC

## 2020-11-15 ENCOUNTER — Ambulatory Visit: Payer: BC Managed Care – PPO | Admitting: Neurology

## 2020-11-15 ENCOUNTER — Telehealth: Payer: Self-pay | Admitting: Neurology

## 2020-11-15 ENCOUNTER — Encounter: Payer: Self-pay | Admitting: Neurology

## 2020-11-15 VITALS — BP 146/85 | HR 66 | Ht 68.0 in | Wt 211.0 lb

## 2020-11-15 DIAGNOSIS — R29898 Other symptoms and signs involving the musculoskeletal system: Secondary | ICD-10-CM

## 2020-11-15 DIAGNOSIS — M79601 Pain in right arm: Secondary | ICD-10-CM

## 2020-11-15 DIAGNOSIS — R270 Ataxia, unspecified: Secondary | ICD-10-CM

## 2020-11-15 DIAGNOSIS — M5412 Radiculopathy, cervical region: Secondary | ICD-10-CM | POA: Diagnosis not present

## 2020-11-15 DIAGNOSIS — M4802 Spinal stenosis, cervical region: Secondary | ICD-10-CM

## 2020-11-15 MED ORDER — OXYCODONE-ACETAMINOPHEN 10-325 MG PO TABS: 1.0000 | ORAL_TABLET | ORAL | 0 refills | Status: DC | PRN

## 2020-11-15 NOTE — Progress Notes (Signed)
[name]    Provider:  [name] Requesting Provider: [name], DO Primary Care Provider:  [name], DO  CC:  Neck pain, arm pain  HPI:  Kenneth Fields is a 60 y.o. male here as requested by [name], DO for neck pain and arm pain and weakness. He has chronic neck pain, ongoing for > 1 year, getting worse, much worse, chronic neck pain, worsening, crick in his neck, always on the right, started worsening over 12 weeks ago. Coming and going, now worsening, he can;t turn his head to the right without pain. He has decreased ROM, waking him up at night. Already been to orthopaedics, already had PT, taken steroid pack, taken analgesicam taken Gabapnetin. Has right arm weakness. Right arm severe radiating pain.   Reviewed notes, labs and imaging from outside physicians, which showed: arm pain  Review of Systems: Patient complains of symptoms per HPI as well as the following symptoms: severe arm pain. Pertinent negatives and positives per HPI. All others negative.   Social History   Socioeconomic History  . Marital status: Married    Spouse name: Not on file  . Number of children: Not on file  . Years of education: Not on file  . Highest education level: 11th grade  Occupational History  . Not on file  Tobacco Use  . Smoking status: Current Every Day Smoker    Packs/day: 1.00    Years: 40.00    Pack years: 40.00    Types: Cigarettes  . Smokeless tobacco: Never Used  Vaping Use  . Vaping Use: Never used  Substance and Sexual Activity  . Alcohol use: Yes    Alcohol/week: 14.0 - 21.0 standard drinks    Types: 14 - 21 Cans of beer per week    Comment: 3 beers per night  . Drug use: Never  . Sexual activity: Not on file  Other Topics Concern  . Not on file  Social History Narrative   Live at home with his wife   Right handed   Caffeine: 1 diet mtn dew daily, occasional tea (decaf), 1 cup of coffee in the mornings (no coffee  when it's warm outside)   Social Determinants of Health   Financial Resource Strain: Not on file  Food Insecurity: Not on file  Transportation Needs: Not on file  Physical Activity: Not on file  Stress: Not on file  Social Connections: Not on file  Intimate Partner Violence: Not on file    Family History  Problem Relation Age of Onset  . Lung cancer Mother   . Cancer Mother     Past Medical History:  Diagnosis Date  . COVID-19 08/2020  . High cholesterol   . Hypertension   . Myasthenia gravis ([place])   . Myasthenia gravis [facility])     Patient Active Problem List   Diagnosis Date Noted  . Excessive daytime sleepiness 08/21/2020  . Snoring 08/21/2020  . Long-term use of high-risk medication 08/21/2020  . Cyst, dermoid, arm, right 04/13/2020  . Myasthenia gravis ([place]) 04/25/2019  . 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 Myasthenia gravis with (acute) exacerbation ([place]) 02/22/2019  . Ocular myasthenia gravis ([place]) 12/27/2018  . Cigarette nicotine dependence without complication 40/98/1191  . Hyperlipidemia 07/01/2016  . Essential hypertension with goal blood pressure less than 130/80 02/14/2013    Past Surgical History:  Procedure Laterality Date  . ELBOW SURGERY Right    for tendonitis  .  TYMPANOSTOMY TUBE PLACEMENT Bilateral    as a child   . WISDOM TOOTH EXTRACTION      Current Outpatient Medications  Medication Sig Dispense Refill  . benazepril (LOTENSIN) 40 MG tablet Take 1 tablet (40 mg total) by mouth daily. Please disregard 20mg  tablets. 90 tablet 3  . cetirizine (ZYRTEC) 10 MG tablet Take 10 mg by mouth daily as needed for allergies.    Marland Kitchen diltiazem (CARDIZEM CD) 120 MG 24 hr capsule Take 1 capsule (120 mg total) by mouth daily. 90 capsule 3  . fluticasone (FLONASE) 50 MCG/ACT nasal spray Place into both nostrils as needed for allergies or rhinitis.    Marland Kitchen gabapentin (NEURONTIN) 300 MG capsule Take 300mg  twice daily and  600mg  at bedtime as needed. 90 capsule 11  . hydrochlorothiazide (HYDRODIURIL) 25 MG tablet Take 1 tablet (25 mg total) by mouth daily. 90 tablet 3  . mycophenolate (CELLCEPT) 500 MG tablet Take 2 tablets (1,000 mg total) by mouth 2 (two) times daily. 360 tablet 3  . oxyCODONE-acetaminophen (PERCOCET) 10-325 MG tablet Take 1-2 tablets by mouth every 4 (four) hours as needed for pain. 56 tablet 0  . pantoprazole (PROTONIX) 40 MG tablet Take 1 tablet (40 mg total) by mouth daily. 90 tablet 3  . rosuvastatin (CRESTOR) 40 MG tablet Take 1 tablet (40 mg total) by mouth daily. 90 tablet 3  . pyridostigmine (MESTINON) 60 MG tablet Take 0.5-1 tablets (30-60 mg total) by mouth 3 (three) times daily as needed. (Patient not taking: Reported on 11/15/2020) 270 tablet 4   No current facility-administered medications for this visit.    Allergies as of 11/15/2020 - Review Complete 11/15/2020  Allergen Reaction Noted  . Bee venom Anaphylaxis 11/15/2020  . Chantix [varenicline tartrate]  04/25/2019    Vitals: BP (!) 146/85 (BP Location: Right Arm, Patient Position: Sitting)   Pulse 66   Ht 5\' 8"  (1.727 m)   Wt 211 lb (95.7 kg)   BMI 32.08 kg/m  Last Weight:  Wt Readings from Last 1 Encounters:  11/15/20 211 lb (95.7 kg)   Last Height:   Ht Readings from Last 1 Encounters:  11/15/20 5\' 8"  (1.727 m)     Physical exam: Exam: Gen: NAD, conversant, well nourised, obese, well groomed                     CV: RRR, no MRG. No Carotid Bruits. No peripheral edema, warm, nontender Eyes: Conjunctivae clear without exudates or hemorrhage  Neuro: Detailed Neurologic Exam  Speech:    Speech is normal; fluent and spontaneous with normal comprehension.  Cognition:    The patient is oriented to person, place, and time;     recent and remote memory intact;     language fluent;     normal attention, concentration,     fund of knowledge Cranial Nerves:    The pupils are equal, round, and reactive to  light.  Visual fields are full to finger confrontation. Extraocular movements are intact. Trigeminal sensation is intact and the muscles of mastication are normal. The face is symmetric. The palate elevates in the midline. Hearing intact. Voice is normal. Shoulder shrug is normal. The tongue has normal motion without fasciculations.    Gait:    imbalance  Motor Observation:    No asymmetry, no atrophy, and no involuntary movements noted. Tone:    Normal muscle tone.    Posture:    Posture is normal. normal erect    Strength:  right biceps 4/5     Sensation: intact to LT     Reflex Exam:  DTR's: diminished right biceps reflex.    Toes:    The toes are downgoing bilaterally.   Clonus:    Clonus is absent.    Assessment/Plan:  60 y.o. male here as requested by [name], DO for neck pain and arm pain and weakness. He has chronic neck pain, ongoing for > 1 year, getting worse, much worse, chronic neck pain, worsening, crick in his neck, always on the right, started worsening over 12 weeks ago. Coming and going, now worsening, he can;t turn his head to the right without pain. He has decreased ROM, waking him up at night. Already been to orthopaedics, already had PT, taken steroid pack, taken analgesicam taken Gabapnetin. Has right arm weakness. Right arm severe radiating pain.  This is a patient with likely right C5-C6 radiculopathy and possibly cervical stenosis, this has been ongoing for over a year and worsening over 12 weeks, he has failed conservative methods, he has been under the care of a Dr., Lucile Shutters to physical therapy, tried medications, been to orthopedics.  At this time he needs an MRI of the cervical spine for evaluation of surgical intervention.  Orders Placed This Encounter  Procedures  . MR CERVICAL SPINE WO CONTRAST   Meds ordered this encounter  Medications  . oxyCODONE-acetaminophen (PERCOCET) 10-325 MG tablet    Sig: Take 1-2 tablets by mouth every 4  (four) hours as needed for pain.    Dispense:  56 tablet    Refill:  0    Cc: [name], DO,  [name], DO  Sarina Ill, MD  [facility] [address] [place], [place] [address]  Phone [phone] Fax [phone]  I spent over 30  minutes of face-to-face and non-face-to-face time with patient on the  1. Right arm weakness   2. Right arm pain   3. Cervical radiculopathy   4. Cervical stenosis of spine   5. Ataxia    diagnosis.  This included previsit chart review, lab review, study review, order entry, electronic health record documentation, patient education on the different diagnostic and therapeutic options, counseling and coordination of care, risks and benefits of management, compliance, or risk factor reduction

## 2020-11-15 NOTE — Telephone Encounter (Signed)
Received stat MRI order for MRI Cervical Spine wo contrast. Got approval via [employer] [place] portal. PA #[id number] (11/15/20 to 05/13/21). Called [facility] scheduling and got patient scheduled for tomorrow at [facility] @ 8:00, pt arrive 7:30. Called patient to advise.

## 2020-11-16 ENCOUNTER — Other Ambulatory Visit: Payer: Self-pay

## 2020-11-16 ENCOUNTER — Ambulatory Visit (HOSPITAL_COMMUNITY)
Admission: RE | Admit: 2020-11-16 | Discharge: 2020-11-16 | Disposition: A | Discharge status: Home / Self Care | Payer: BC Managed Care – PPO | Source: Ambulatory Visit | Attending: Neurology | Admitting: Neurology

## 2020-11-16 ENCOUNTER — Other Ambulatory Visit: Payer: Self-pay | Admitting: Neurology

## 2020-11-16 DIAGNOSIS — M5412 Radiculopathy, cervical region: Secondary | ICD-10-CM | POA: Insufficient documentation

## 2020-11-16 DIAGNOSIS — M50122 Cervical disc disorder at C5-C6 level with radiculopathy: Secondary | ICD-10-CM

## 2020-11-16 DIAGNOSIS — R29898 Other symptoms and signs involving the musculoskeletal system: Secondary | ICD-10-CM | POA: Insufficient documentation

## 2020-11-16 DIAGNOSIS — M79601 Pain in right arm: Secondary | ICD-10-CM | POA: Diagnosis not present

## 2020-11-16 DIAGNOSIS — R270 Ataxia, unspecified: Secondary | ICD-10-CM | POA: Insufficient documentation

## 2020-11-16 DIAGNOSIS — M4802 Spinal stenosis, cervical region: Secondary | ICD-10-CM | POA: Diagnosis not present

## 2020-11-16 DIAGNOSIS — M542 Cervicalgia: Secondary | ICD-10-CM | POA: Diagnosis not present

## 2020-11-16 IMAGING — MR MR CERVICAL SPINE W/O CM
5 of 6 series · 33 of 48 positions shown · non-contrast
Comparison: None.

CLINICAL DATA: Neck pain with right arm pain and weakness

EXAM:
MRI CERVICAL SPINE WITHOUT CONTRAST
TECHNIQUE: Multiplanar, multisequence MR imaging of the cervical spine was
performed. No intravenous contrast was administered.

[Series 5: T1 · sagittal · 3.0mm · 0.69mm/px · 5 of 15 slices shown (1 of 2)]
[im 1/15]
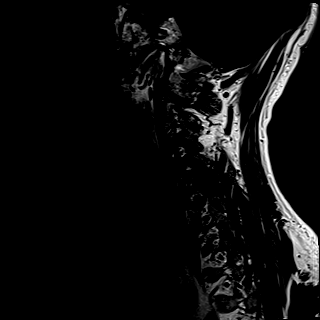
[im 4/15]
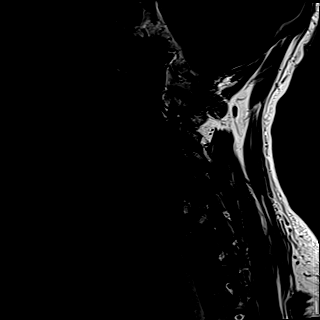
[im 8/15]
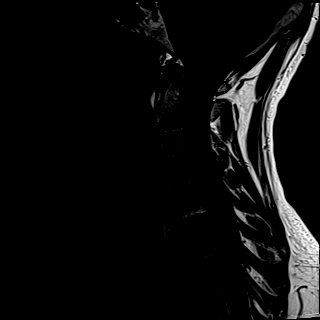
[im 11/15]
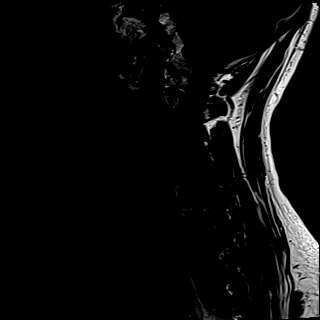
[im 15/15]
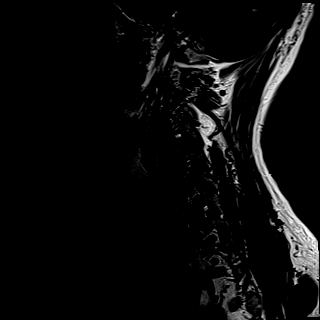

[Series 6: T2 · sagittal · 3.0mm · 0.69mm/px · 5 of 15 slices shown (1 of 2)]
[im 1/15]
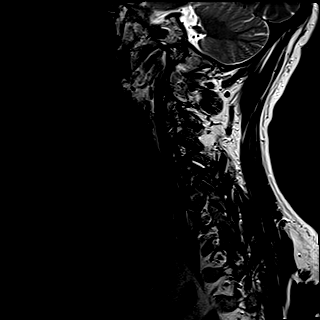
[im 4/15]
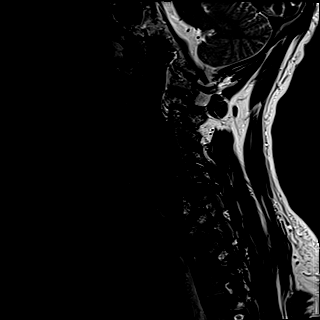
[im 8/15]
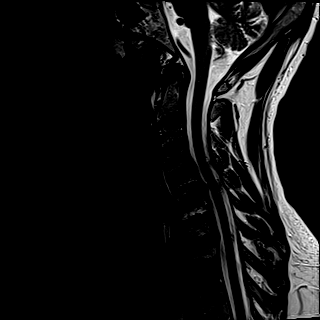
[im 11/15]
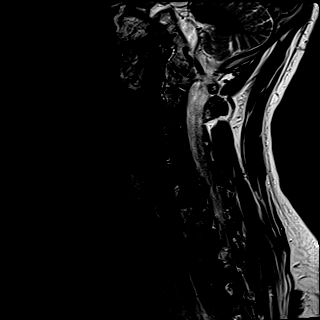
[im 15/15]
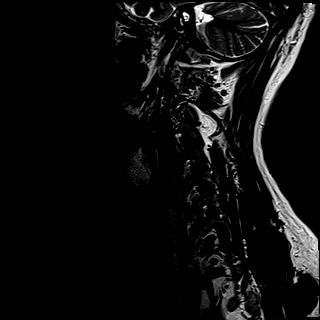

[Series 7: STIR · sagittal · 3.0mm · 0.86mm/px · 1 of 15 slices shown]
[im 1/15]
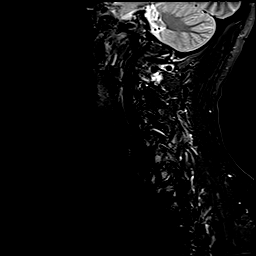

[Series 8: T2 · axial · 3.0mm · 0.70mm/px · z∈[-110,+4]mm · 11 of 36 slices shown (2 of 2)]
[im 1/36]
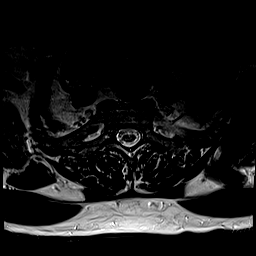
[im 4/36]
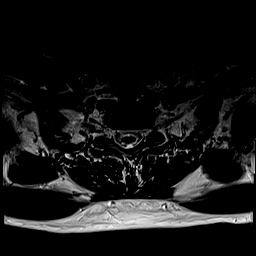
[im 8/36]
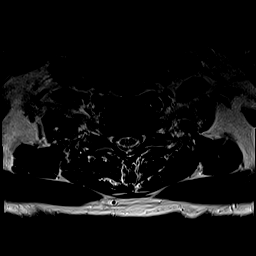
[im 11/36]
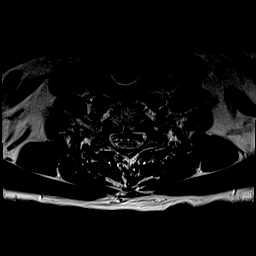
[im 15/36]
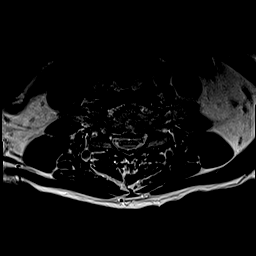
[im 18/36]
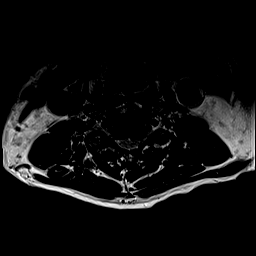
[im 22/36]
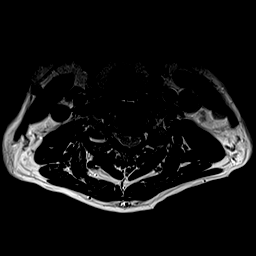
[im 25/36]
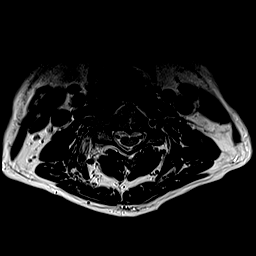
[im 29/36]
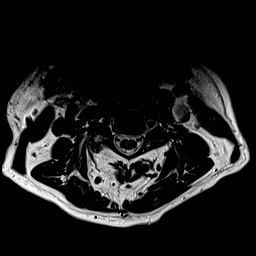
[im 32/36]
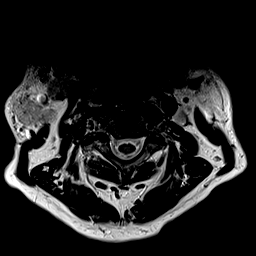
[im 36/36]
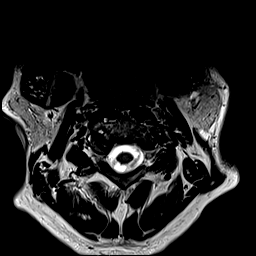

[Series 10: T1 · axial · 3.0mm · 0.35mm/px · z∈[-110,+4]mm · 11 of 36 slices shown (2 of 2)]
[im 1/36]
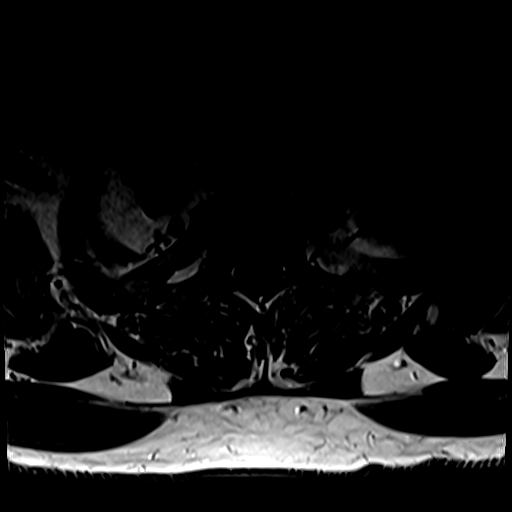
[im 4/36]
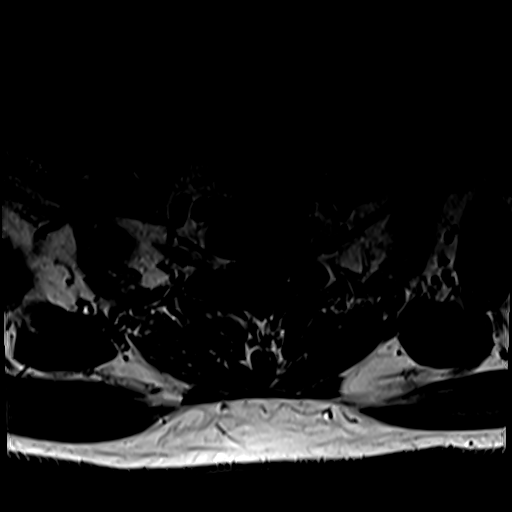
[im 8/36]
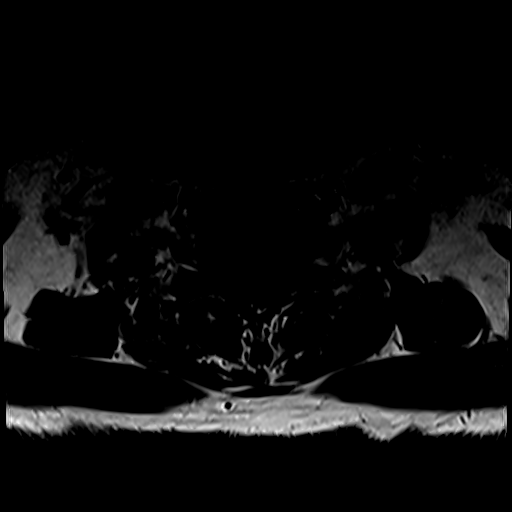
[im 11/36]
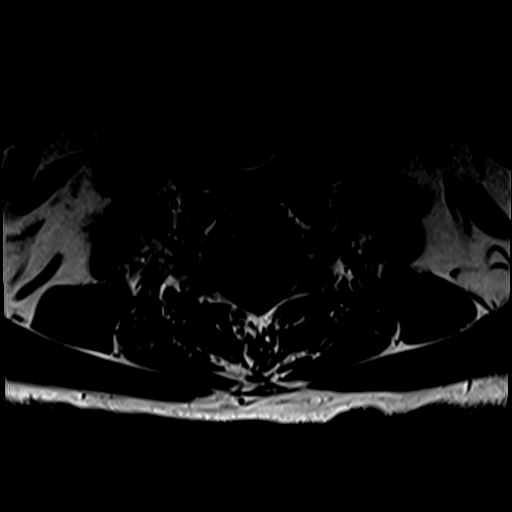
[im 15/36]
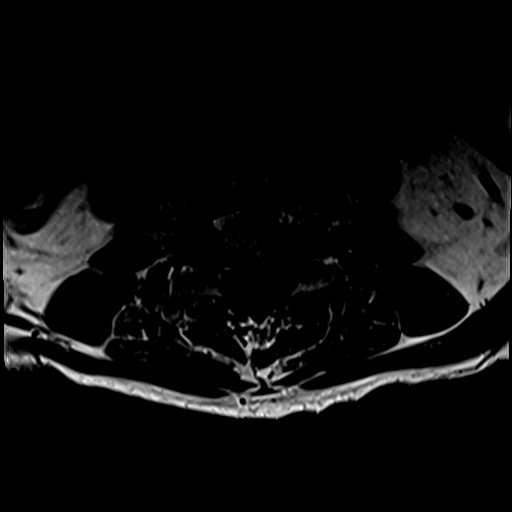
[im 18/36]
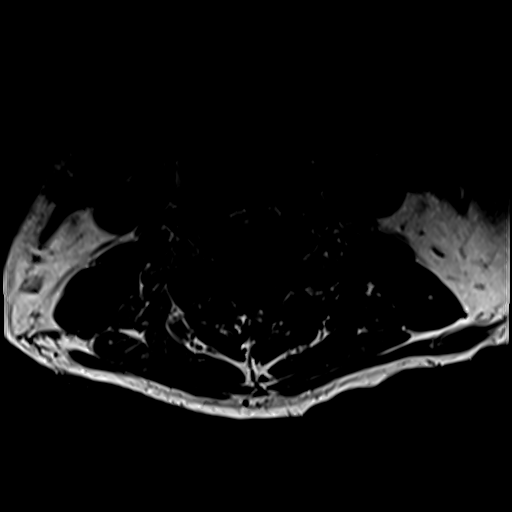
[im 22/36]
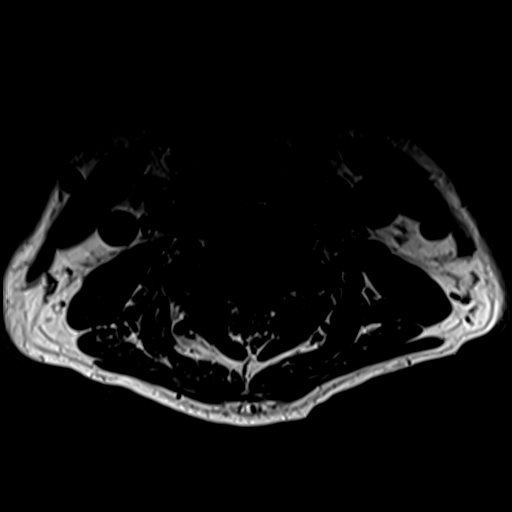
[im 25/36]
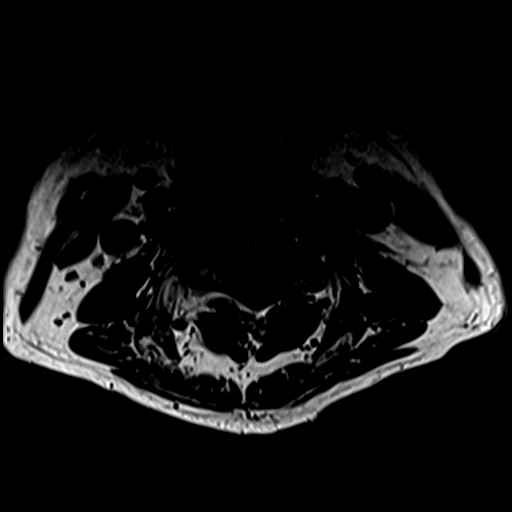
[im 29/36]
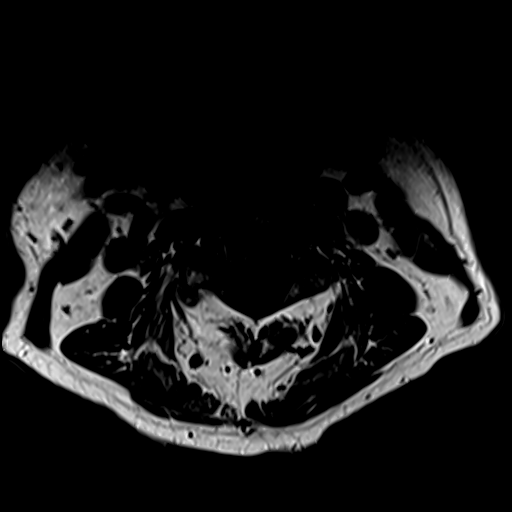
[im 32/36]
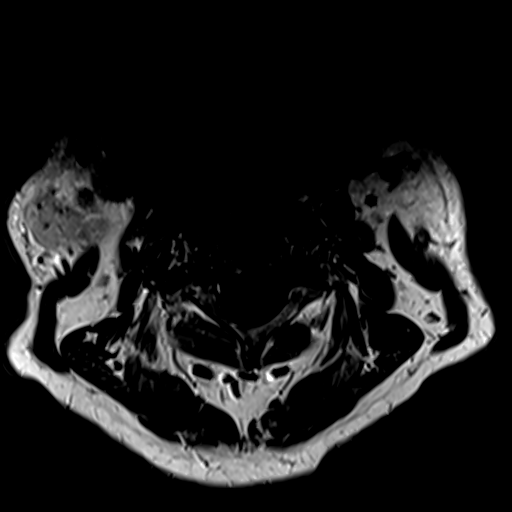
[im 36/36]
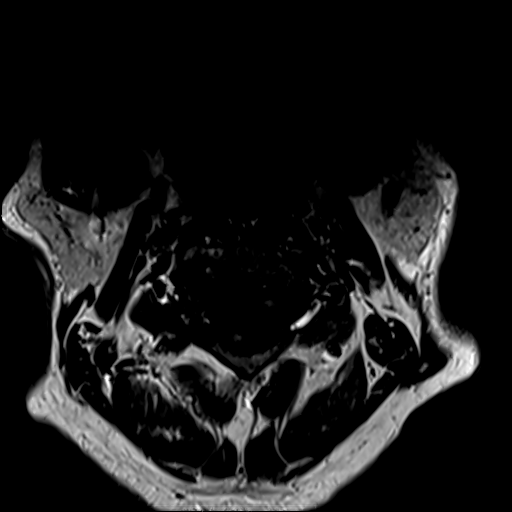

[33 of 48 positions shown; findings below may reference images not displayed]

FINDINGS: Alignment: There is no significant anteroposterior listhesis.

Vertebrae: Vertebral body heights are maintained. There is
degenerative endplate irregularity at C6-C7 and C7-T1. Chronic
appearing degenerative endplate marrow changes at these levels.
There is marrow edema associated with the right C5-C6 facets.

Cord: Normal caliber and signal.

Posterior Fossa, vertebral arteries, paraspinal tissues:
Unremarkable.

Disc levels:

C2-C3: Right greater than left uncovertebral and facet hypertrophy.
No canal stenosis. Mild foraminal stenosis.

C3-C4: Facet hypertrophy. No canal stenosis. Mild foraminal
stenosis.

C4-C5: Disc bulge with endplate osteophytes. Uncovertebral
hypertrophy. Right greater than left facet hypertrophy with right
joint effusion. No canal stenosis. Mild to moderate right foraminal
stenosis. No left foraminal stenosis.

C5-C6: Disc bulge with endplate osteophytes. Uncovertebral
hypertrophy. Right greater than facet hypertrophy. Minor canal
stenosis. Moderate to marked foraminal stenosis.

C6-C7: Disc bulge with endplate osteophytes. No canal stenosis. No
right foraminal stenosis. Moderate left foraminal stenosis.

C7-T1: Disc bulge with endplate osteophytes. No canal stenosis. No
right foraminal stenosis. Mild left foraminal stenosis.
IMPRESSION: Multilevel degenerative changes as detailed above. No high-grade
canal stenosis. Foraminal narrowing is greatest at C5-C6. Right
greater than left facet arthropathy.

## 2020-11-19 ENCOUNTER — Other Ambulatory Visit: Payer: Self-pay | Admitting: Neurology

## 2020-11-19 ENCOUNTER — Ambulatory Visit: Payer: Self-pay | Admitting: Neurology

## 2020-11-19 DIAGNOSIS — G7000 Myasthenia gravis without (acute) exacerbation: Secondary | ICD-10-CM

## 2020-11-19 NOTE — Telephone Encounter (Signed)
Noted, thank you

## 2020-11-21 ENCOUNTER — Other Ambulatory Visit: Payer: Self-pay | Admitting: Family Medicine

## 2020-11-21 DIAGNOSIS — K219 Gastro-esophageal reflux disease without esophagitis: Secondary | ICD-10-CM

## 2020-12-05 DIAGNOSIS — S233XXA Sprain of ligaments of thoracic spine, initial encounter: Secondary | ICD-10-CM | POA: Diagnosis not present

## 2020-12-05 DIAGNOSIS — S134XXA Sprain of ligaments of cervical spine, initial encounter: Secondary | ICD-10-CM | POA: Diagnosis not present

## 2020-12-12 DIAGNOSIS — S134XXA Sprain of ligaments of cervical spine, initial encounter: Secondary | ICD-10-CM | POA: Diagnosis not present

## 2020-12-12 DIAGNOSIS — S233XXA Sprain of ligaments of thoracic spine, initial encounter: Secondary | ICD-10-CM | POA: Diagnosis not present

## 2020-12-20 DIAGNOSIS — S134XXA Sprain of ligaments of cervical spine, initial encounter: Secondary | ICD-10-CM | POA: Diagnosis not present

## 2020-12-20 DIAGNOSIS — S233XXA Sprain of ligaments of thoracic spine, initial encounter: Secondary | ICD-10-CM | POA: Diagnosis not present

## 2020-12-27 DIAGNOSIS — S134XXA Sprain of ligaments of cervical spine, initial encounter: Secondary | ICD-10-CM | POA: Diagnosis not present

## 2020-12-27 DIAGNOSIS — S233XXA Sprain of ligaments of thoracic spine, initial encounter: Secondary | ICD-10-CM | POA: Diagnosis not present

## 2020-12-31 DIAGNOSIS — M542 Cervicalgia: Secondary | ICD-10-CM | POA: Diagnosis not present

## 2020-12-31 DIAGNOSIS — I10 Essential (primary) hypertension: Secondary | ICD-10-CM | POA: Diagnosis not present

## 2020-12-31 DIAGNOSIS — M47812 Spondylosis without myelopathy or radiculopathy, cervical region: Secondary | ICD-10-CM | POA: Diagnosis not present

## 2020-12-31 DIAGNOSIS — Z6831 Body mass index (BMI) 31.0-31.9, adult: Secondary | ICD-10-CM | POA: Diagnosis not present

## 2021-01-04 ENCOUNTER — Other Ambulatory Visit: Payer: Self-pay | Admitting: Family Medicine

## 2021-01-14 ENCOUNTER — Ambulatory Visit: Payer: BC Managed Care – PPO | Admitting: Neurology

## 2021-01-14 ENCOUNTER — Ambulatory Visit: Payer: BC Managed Care – PPO | Admitting: Family Medicine

## 2021-01-15 ENCOUNTER — Ambulatory Visit: Payer: BC Managed Care – PPO | Admitting: Family Medicine

## 2021-01-15 ENCOUNTER — Other Ambulatory Visit: Payer: Self-pay

## 2021-01-15 ENCOUNTER — Encounter: Payer: Self-pay | Admitting: Family Medicine

## 2021-01-15 VITALS — BP 153/96 | HR 67 | Temp 98.1°F | Ht 68.0 in | Wt 205.8 lb

## 2021-01-15 DIAGNOSIS — E099 Drug or chemical induced diabetes mellitus without complications: Secondary | ICD-10-CM

## 2021-01-15 DIAGNOSIS — M5412 Radiculopathy, cervical region: Secondary | ICD-10-CM

## 2021-01-15 DIAGNOSIS — E1169 Type 2 diabetes mellitus with other specified complication: Secondary | ICD-10-CM | POA: Diagnosis not present

## 2021-01-15 DIAGNOSIS — E785 Hyperlipidemia, unspecified: Secondary | ICD-10-CM

## 2021-01-15 DIAGNOSIS — G7000 Myasthenia gravis without (acute) exacerbation: Secondary | ICD-10-CM

## 2021-01-15 DIAGNOSIS — E1159 Type 2 diabetes mellitus with other circulatory complications: Secondary | ICD-10-CM

## 2021-01-15 DIAGNOSIS — G4733 Obstructive sleep apnea (adult) (pediatric): Secondary | ICD-10-CM

## 2021-01-15 DIAGNOSIS — T380X5A Adverse effect of glucocorticoids and synthetic analogues, initial encounter: Secondary | ICD-10-CM

## 2021-01-15 DIAGNOSIS — I152 Hypertension secondary to endocrine disorders: Secondary | ICD-10-CM

## 2021-01-15 LAB — BAYER DCA HB A1C WAIVED: HB A1C (BAYER DCA - WAIVED): 6.9 % (ref <7.0)

## 2021-01-15 MED ORDER — MELOXICAM 15 MG PO TABS: 7.5000 mg | ORAL_TABLET | Freq: Every day | ORAL | 0 refills | Status: DC | PRN

## 2021-01-15 NOTE — Progress Notes (Signed)
Subjective: CC: DM, MG, HTN, HLD, OSA PCP: [name], DO UXN:ATFT Kenneth Fields is a 60 y.o. male presenting to clinic today for:  1. Medication induced Type 2 Diabetes with hypertension, hyperlipidemia; neck pain:  He notes that he had a course of steroids because he started having some radicular pain in his neck.  He seen his orthopedist, neurologist and now is in the hands of a neurosurgeon.  Apparently the issues at his C5 on C6 and there are plans for injection therapy on 19 May.  He notes that the pain is slightly better but is still fairly persistent.  He did not tolerate gabapentin and the Percocet was too sedating so he discontinued.  He is compliant with his Crestor, Lotensin, Cardizem and hydrochlorothiazide.  No chest pain, shortness of breath, blurred vision, edema or falls  Last eye exam: Up-to-date Last foot exam: Up-to-date Last A1c:  Lab Results  Component Value Date   HGBA1C 6.6 (H) 07/16/2020   Nephropathy screen indicated?:  On ACE inhibitor Last flu, zoster and/or pneumovax:  Immunization History  Administered Date(s) Administered  . Janssen (J&J) SARS-COV-2 Vaccination 01/23/2020  . Pneumococcal Polysaccharide-23 07/23/2014  . Tdap 09/22/2009, 07/13/2010, 02/14/2013   2. Myasthenia Gravis Symptoms have been stable.  He continues to take CellCept daily and has not needed the Mestinon at all recently.  3. OSA Dr. Brett Fairy, with neurology, diagnosed patient with moderate obstructive sleep apnea in 2021.  CPAP machine was recommended.  He unfortunately has not gotten his CPAP machine due to a shortage in backorder.  Hopefully will get this in the next month or 2.  ROS: Per HPI  Allergies  Allergen Reactions  . Bee Venom Anaphylaxis  . Chantix [Varenicline Tartrate]     "mental problems"   Past Medical History:  Diagnosis Date  . COVID-19 08/2020  . High cholesterol   . Hypertension   . Myasthenia gravis ([place])   . Myasthenia gravis ([place])      Current Outpatient Medications:  .  benazepril (LOTENSIN) 40 MG tablet, Take 1 tablet (40 mg total) by mouth daily., Disp: 90 tablet, Rfl: 0 .  cetirizine (ZYRTEC) 10 MG tablet, Take 10 mg by mouth daily as needed for allergies., Disp: , Rfl:  .  diltiazem (CARDIZEM CD) 120 MG 24 hr capsule, Take 1 capsule (120 mg total) by mouth daily., Disp: 90 capsule, Rfl: 3 .  fluticasone (FLONASE) 50 MCG/ACT nasal spray, Place into both nostrils as needed for allergies or rhinitis., Disp: , Rfl:  .  gabapentin (NEURONTIN) 300 MG capsule, Take 300mg  twice daily and 600mg  at bedtime as needed., Disp: 90 capsule, Rfl: 11 .  hydrochlorothiazide (HYDRODIURIL) 25 MG tablet, Take 1 tablet (25 mg total) by mouth daily., Disp: 90 tablet, Rfl: 3 .  mycophenolate (CELLCEPT) 500 MG tablet, TAKE 2 TABLETS BY MOUTH TWICE DAILY, Disp: 120 tablet, Rfl: 2 .  oxyCODONE-acetaminophen (PERCOCET) 10-325 MG tablet, Take 1-2 tablets by mouth every 4 (four) hours as needed for pain., Disp: 56 tablet, Rfl: 0 .  pantoprazole (PROTONIX) 40 MG tablet, TAKE 1 TABLET BY MOUTH EVERY DAY, Disp: 90 tablet, Rfl: 0 .  pyridostigmine (MESTINON) 60 MG tablet, Take 0.5-1 tablets (30-60 mg total) by mouth 3 (three) times daily as needed. (Patient not taking: Reported on 11/15/2020), Disp: 270 tablet, Rfl: 4 .  rosuvastatin (CRESTOR) 40 MG tablet, Take 1 tablet (40 mg total) by mouth daily., Disp: 90 tablet, Rfl: 3 Social History   Socioeconomic History  .  Marital status: Married    Spouse name: Not on file  . Number of children: Not on file  . Years of education: Not on file  . Highest education level: 11th grade  Occupational History  . Not on file  Tobacco Use  . Smoking status: Current Every Day Smoker    Packs/day: 1.00    Years: 40.00    Pack years: 40.00    Types: Cigarettes  . Smokeless tobacco: Never Used  Vaping Use  . Vaping Use: Never used  Substance and Sexual Activity  . Alcohol use: Yes    Alcohol/week: 14.0 -  21.0 standard drinks    Types: 14 - 21 Cans of beer per week    Comment: 3 beers per night  . Drug use: Never  . Sexual activity: Not on file  Other Topics Concern  . Not on file  Social History Narrative   Live at home with his wife   Right handed   Caffeine: 1 diet mtn dew daily, occasional tea (decaf), 1 cup of coffee in the mornings (no coffee when it's warm outside)   Social Determinants of Health   Financial Resource Strain: Not on file  Food Insecurity: Not on file  Transportation Needs: Not on file  Physical Activity: Not on file  Stress: Not on file  Social Connections: Not on file  Intimate Partner Violence: Not on file   Family History  Problem Relation Age of Onset  . Lung cancer Mother   . Cancer Mother     Objective: Office vital signs reviewed. BP (!) 153/96   Pulse 67   Temp 98.1 F (36.7 C)   Ht 5\' 8"  (1.727 m)   Wt 205 lb 12.8 oz (93.4 kg)   SpO2 97%   BMI 31.29 kg/m   Physical Examination:  General: Awake, alert, well-appearing male, No acute distress HEENT: Normal; sclera white Cardio: regular rate and rhythm, S1S2 heard, no murmurs appreciated Pulm: clear to auscultation bilaterally, no wheezes, rhonchi or rales; normal work of breathing on room air Extremities: warm, well perfused, No edema, cyanosis or clubbing; +2 pulses bilaterally MSK: normal gait and station  Assessment/ Plan: 60 y.o. male   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 ([place]) - Plan: Bayer DCA Hb A1c Waived  Hypertension associated with diabetes ([place])  Hyperlipidemia associated with type 2 diabetes mellitus ([place]) - Plan: Lipid panel  Myasthenia gravis ([place])  Moderate obstructive sleep apnea  Cervical radiculopathy - Plan: meloxicam (MOBIC) 15 MG tablet  Sugar is controlled but borderline with A1c is 6.9 today.  Unsure that the recent corticosteroids have impacted this level.  Would like to see him closer to given plans of corticosteroid injection  soon to recheck A1c.  Blood pressure was not at goal.  Unsure if this is due to discomfort as above or if he has uncontrolled readings.  Would like him to get this rechecked with nurse in the next 1 to 2 weeks.  May need to consider doubling Cardizem as he is on max dose of hydrochlorothiazide and benazepril  He will come in for fasting lipid  MG is stable.  Continue following up with neurology as scheduled  Awaiting CPAP machine.  Neurology is trying to work on getting this sooner for patient  Meloxicam prescribed.  Advised to start with 7.5 mg daily as needed.  Avoid other NSAIDs.  Use Tylenol if needed.  Discontinue once symptoms have improved, which will hopefully be after corticosteroid injection in 3  weeks  No orders of the defined types were placed in this encounter.  No orders of the defined types were placed in this encounter.    [name], DO [place] [phone]

## 2021-01-15 NOTE — Patient Instructions (Signed)
I want you to come in for a FASTING cholesterol some time soon.  Your triglycerides were high last visit and these need to be rechecked.  Sugar is borderline.  meloxicam sent for your neck pain.  Take as directed with tylenol ONLY.  You have prescribed a nonsteroidal anti-inflammatory drug (NSAID) today. This will help with your pain and inflammation. Please do not take any other NSAIDs (ibuprofen/Motrin/Advil, naproxen/Aleve, meloxicam/Mobic, Voltaren/diclofenac). Please make sure to eat a meal when taking this medication.   Caution:  If you have a history of acid reflux/indigestion, I recommend that you take an antacid (such as Prilosec, Prevacid) daily while on the NSAID.  If you have a history of bleeding disorder, gastric ulcer, are on a blood thinner (like warfarin/Coumadin, Xarelto, Eliquis, etc) please do not take NSAID.  If you have ever had a heart attack, you should not take NSAIDs.

## 2021-02-05 ENCOUNTER — Other Ambulatory Visit: Payer: Self-pay

## 2021-02-05 ENCOUNTER — Other Ambulatory Visit: Payer: BC Managed Care – PPO

## 2021-02-05 DIAGNOSIS — E1169 Type 2 diabetes mellitus with other specified complication: Secondary | ICD-10-CM | POA: Diagnosis not present

## 2021-02-05 DIAGNOSIS — M47812 Spondylosis without myelopathy or radiculopathy, cervical region: Secondary | ICD-10-CM | POA: Diagnosis not present

## 2021-02-05 DIAGNOSIS — E785 Hyperlipidemia, unspecified: Secondary | ICD-10-CM | POA: Diagnosis not present

## 2021-02-06 ENCOUNTER — Other Ambulatory Visit: Payer: Self-pay | Admitting: Family Medicine

## 2021-02-06 DIAGNOSIS — E781 Pure hyperglyceridemia: Secondary | ICD-10-CM

## 2021-02-06 DIAGNOSIS — E1169 Type 2 diabetes mellitus with other specified complication: Secondary | ICD-10-CM

## 2021-02-06 LAB — LIPID PANEL
Chol/HDL Ratio: 4.4 ratio (ref 0.0–5.0)
Cholesterol, Total: 170 mg/dL (ref 100–199)
HDL: 39 mg/dL — ABNORMAL LOW (ref >39)
LDL Chol Calc (NIH): 73 mg/dL (ref 0–99)
Triglycerides: 363 mg/dL — ABNORMAL HIGH (ref 0–149)
VLDL Cholesterol Cal: 58 mg/dL — ABNORMAL HIGH (ref 5–40)

## 2021-02-06 MED ORDER — VASCEPA 1 G PO CAPS: 2.0000 g | ORAL_CAPSULE | Freq: Two times a day (BID) | ORAL | 12 refills | Status: DC

## 2021-02-07 ENCOUNTER — Other Ambulatory Visit: Payer: Self-pay | Admitting: Family Medicine

## 2021-02-17 ENCOUNTER — Other Ambulatory Visit: Payer: Self-pay | Admitting: Family Medicine

## 2021-02-17 DIAGNOSIS — K219 Gastro-esophageal reflux disease without esophagitis: Secondary | ICD-10-CM

## 2021-03-06 DIAGNOSIS — I10 Essential (primary) hypertension: Secondary | ICD-10-CM | POA: Diagnosis not present

## 2021-03-06 DIAGNOSIS — M4802 Spinal stenosis, cervical region: Secondary | ICD-10-CM | POA: Diagnosis not present

## 2021-03-06 DIAGNOSIS — M542 Cervicalgia: Secondary | ICD-10-CM | POA: Diagnosis not present

## 2021-03-06 DIAGNOSIS — Z6831 Body mass index (BMI) 31.0-31.9, adult: Secondary | ICD-10-CM | POA: Diagnosis not present

## 2021-03-22 ENCOUNTER — Other Ambulatory Visit: Payer: Self-pay | Admitting: Neurology

## 2021-03-22 DIAGNOSIS — G7000 Myasthenia gravis without (acute) exacerbation: Secondary | ICD-10-CM

## 2021-04-10 NOTE — Telephone Encounter (Signed)
Received a notification from the DME company that pt is unable to start the machine due to financial concerns"   "I called this patient Kenneth Fields d.o.b [birth year] and BT# [id number] today to get him scheduled for his CPAP Luna order and after discussing his financials, patient stated he didn't have the money to cover the initial fee which is $215 for the first half of the month.  His first payment would be $429.42.  His monthly payment would be $75.41 monthly for 9 months.  Patient stated he thought his BCBS would pay for the machine.  I informed him that his [place] pays 70% for the device and he would be responsible for the remainder.  I gave him our billing number to get some financial assistance.  I just wanted you to know in case [facility] gets a call.  The patient's doctor is [employer].

## 2021-05-08 ENCOUNTER — Other Ambulatory Visit: Payer: Self-pay | Admitting: Family Medicine

## 2021-05-08 DIAGNOSIS — E781 Pure hyperglyceridemia: Secondary | ICD-10-CM

## 2021-05-08 DIAGNOSIS — E1169 Type 2 diabetes mellitus with other specified complication: Secondary | ICD-10-CM

## 2021-05-08 DIAGNOSIS — E785 Hyperlipidemia, unspecified: Secondary | ICD-10-CM

## 2021-07-22 ENCOUNTER — Ambulatory Visit: Payer: BC Managed Care – PPO | Admitting: Nurse Practitioner

## 2021-07-22 ENCOUNTER — Other Ambulatory Visit: Payer: Self-pay | Admitting: Neurology

## 2021-07-22 ENCOUNTER — Encounter: Payer: Self-pay | Admitting: Nurse Practitioner

## 2021-07-22 ENCOUNTER — Other Ambulatory Visit: Payer: Self-pay

## 2021-07-22 VITALS — BP 179/96 | HR 70 | Temp 98.2°F | Resp 20 | Ht 68.0 in | Wt 210.0 lb

## 2021-07-22 DIAGNOSIS — I152 Hypertension secondary to endocrine disorders: Secondary | ICD-10-CM

## 2021-07-22 DIAGNOSIS — E781 Pure hyperglyceridemia: Secondary | ICD-10-CM

## 2021-07-22 DIAGNOSIS — E782 Mixed hyperlipidemia: Secondary | ICD-10-CM

## 2021-07-22 DIAGNOSIS — E1169 Type 2 diabetes mellitus with other specified complication: Secondary | ICD-10-CM

## 2021-07-22 DIAGNOSIS — E1159 Type 2 diabetes mellitus with other circulatory complications: Secondary | ICD-10-CM | POA: Diagnosis not present

## 2021-07-22 DIAGNOSIS — E785 Hyperlipidemia, unspecified: Secondary | ICD-10-CM

## 2021-07-22 DIAGNOSIS — R42 Dizziness and giddiness: Secondary | ICD-10-CM | POA: Insufficient documentation

## 2021-07-22 DIAGNOSIS — I10 Essential (primary) hypertension: Secondary | ICD-10-CM

## 2021-07-22 DIAGNOSIS — G7000 Myasthenia gravis without (acute) exacerbation: Secondary | ICD-10-CM

## 2021-07-22 MED ORDER — VASCEPA 1 G PO CAPS
2.0000 g | ORAL_CAPSULE | Freq: Two times a day (BID) | ORAL | 12 refills | Status: DC
Start: 2021-07-22 — End: 2022-08-06

## 2021-07-22 MED ORDER — MECLIZINE HCL 12.5 MG PO TABS: 12.5000 mg | ORAL_TABLET | Freq: Three times a day (TID) | ORAL | 0 refills | Status: DC | PRN

## 2021-07-22 MED ORDER — HYDROCHLOROTHIAZIDE 25 MG PO TABS
25.0000 mg | ORAL_TABLET | Freq: Every day | ORAL | 3 refills | Status: DC
Start: 2021-07-22 — End: 2022-07-30

## 2021-07-22 NOTE — Progress Notes (Signed)
Acute Office Visit  Subjective:    Patient ID: Kenneth Fields, male    DOB: 1961/03/02, 60 y.o.   MRN: [id number]  Chief Complaint  Patient presents with   Elevated blood pressure, dizziness, blurry vision     Dizziness This is a recurrent problem. The problem occurs intermittently. The problem has been unchanged. Associated symptoms include vertigo and a visual change. Pertinent negatives include no congestion, fatigue, headaches, nausea, numbness or rash. Nothing aggravates the symptoms. Treatments tried: Epley's maneuver. The treatment provided mild relief.      Hypertension: Patient here for follow-up of elevated blood pressure. He is not exercising and is adherent to low salt diet.  Blood pressure is not well controlled at home. Cardiac symptoms none. Patient denies chest pain, chest pressure/discomfort, fatigue, irregular heart beat, and syncope.  Cardiovascular risk factors: advanced age (older than 29 for men, 54 for women), dyslipidemia, hypertension, male gender, and obesity (BMI >= 30 kg/m2). Use of agents associated with hypertension: none. History of target organ damage: none.   Mixed hyperlipidemia  Pt presents with hyperlipidemia. Patient was diagnosed in 07/01/2016 compliance with treatment has been good the patient is compliant with medications, maintains a low cholesterol diet , follows up as directed . The patient denies experiencing any hypercholesterolemia related symptoms.       Past Medical History:  Diagnosis Date   COVID-19 08/2020   High cholesterol    Hypertension    Myasthenia gravis ([place])    Myasthenia gravis ([place])     Past Surgical History:  Procedure Laterality Date   ELBOW SURGERY Right    for tendonitis   TYMPANOSTOMY TUBE PLACEMENT Bilateral    as a child    WISDOM TOOTH EXTRACTION      Family History  Problem Relation Age of Onset   Lung cancer Mother    Cancer Mother     Social History   Socioeconomic History   Marital status:  Married    Spouse name: Not on file   Number of children: Not on file   Years of education: Not on file   Highest education level: 11th grade  Occupational History   Not on file  Tobacco Use   Smoking status: Every Day    Packs/day: 1.00    Years: 40.00    Pack years: 40.00    Types: Cigarettes   Smokeless tobacco: Never  Vaping Use   Vaping Use: Never used  Substance and Sexual Activity   Alcohol use: Yes    Alcohol/week: 14.0 - 21.0 standard drinks    Types: 14 - 21 Cans of beer per week    Comment: 3 beers per night   Drug use: Never   Sexual activity: Not on file  Other Topics Concern   Not on file  Social History Narrative   Live at home with his wife   Right handed   Caffeine: 1 diet mtn dew daily, occasional tea (decaf), 1 cup of coffee in the mornings (no coffee when it's warm outside)   Social Determinants of Health   Financial Resource Strain: Not on file  Food Insecurity: Not on file  Transportation Needs: Not on file  Physical Activity: Not on file  Stress: Not on file  Social Connections: Not on file  Intimate Partner Violence: Not on file    Outpatient Medications Prior to Visit  Medication Sig Dispense Refill   benazepril (LOTENSIN) 40 MG tablet TAKE 1 TABLET BY MOUTH EVERY DAY 90 tablet 1  cetirizine (ZYRTEC) 10 MG tablet Take 10 mg by mouth daily as needed for allergies.     diltiazem (CARDIZEM CD) 120 MG 24 hr capsule TAKE 1 CAPSULE BY MOUTH EVERY DAY 90 capsule 1   mycophenolate (CELLCEPT) 500 MG tablet TAKE 2 TABLETS BY MOUTH TWICE DAILY 120 tablet 2   pantoprazole (PROTONIX) 40 MG tablet TAKE 1 TABLET BY MOUTH EVERY DAY 90 tablet 1   pyridostigmine (MESTINON) 60 MG tablet Take 0.5-1 tablets (30-60 mg total) by mouth 3 (three) times daily as needed. 270 tablet 4   rosuvastatin (CRESTOR) 40 MG tablet Take 1 tablet (40 mg total) by mouth daily. 90 tablet 3   meloxicam (MOBIC) 15 MG tablet Take 0.5-1 tablets (7.5-15 mg total) by mouth daily as  needed for pain. (Patient not taking: Reported on 07/22/2021) 30 tablet 0   fluticasone (FLONASE) 50 MCG/ACT nasal spray Place into both nostrils as needed for allergies or rhinitis.     hydrochlorothiazide (HYDRODIURIL) 25 MG tablet Take 1 tablet (25 mg total) by mouth daily. (Patient not taking: Reported on 07/22/2021) 90 tablet 3   VASCEPA 1 g capsule Take 2 capsules (2 g total) by mouth 2 (two) times daily. (Patient not taking: Reported on 07/22/2021) 120 capsule 12   No facility-administered medications prior to visit.    Allergies  Allergen Reactions   Bee Venom Anaphylaxis   Chantix [Varenicline Tartrate]     "mental problems"    Review of Systems  Constitutional: Negative.  Negative for fatigue.  HENT: Negative.  Negative for congestion.   Eyes: Negative.   Respiratory: Negative.    Cardiovascular: Negative.   Gastrointestinal: Negative.  Negative for nausea.  Skin:  Negative for rash.  Neurological:  Positive for dizziness, vertigo and light-headedness. Negative for numbness and headaches.       Blurry vision   All other systems reviewed and are negative.     Objective:    Physical Exam Vitals and nursing note reviewed.  Constitutional:      Appearance: Normal appearance. He is obese.  HENT:     Head: Normocephalic.     Right Ear: Ear canal and external ear normal.     Left Ear: Ear canal and external ear normal.     Mouth/Throat:     Mouth: Mucous membranes are moist.     Pharynx: Oropharynx is clear.  Eyes:     Conjunctiva/sclera: Conjunctivae normal.  Cardiovascular:     Rate and Rhythm: Normal rate.     Pulses: Normal pulses.     Heart sounds: Normal heart sounds.  Pulmonary:     Effort: Pulmonary effort is normal.     Breath sounds: Normal breath sounds.  Abdominal:     General: Bowel sounds are normal.  Skin:    Findings: No rash.  Neurological:     Mental Status: He is oriented to person, place, and time.  Psychiatric:        Behavior:  Behavior normal.    BP (!) 179/96   Pulse 70   Temp 98.2 F (36.8 C) (Temporal)   Resp 20   Ht 5\' 8"  (1.727 m)   Wt 210 lb (95.3 kg)   SpO2 98%   BMI 31.93 kg/m  Wt Readings from Last 3 Encounters:  07/22/21 210 lb (95.3 kg)  01/15/21 205 lb 12.8 oz (93.4 kg)  11/15/20 211 lb (95.7 kg)    Health Maintenance Due  Topic Date Due   Zoster Vaccines- Shingrix (1 of  2) Never done   COLONOSCOPY (Pts 45-21yrs Insurance coverage will need to be confirmed)  Never done   Pneumococcal Vaccine 69-25 Years old (2 - PCV) 07/24/2015   COVID-19 Vaccine (2 - [facility]sen risk series) 02/20/2020    There are no preventive care reminders to display for this patient.   Lab Results  Component Value Date   TSH 0.636 07/16/2020   Lab Results  Component Value Date   WBC 7.8 07/16/2020   HGB 16.4 07/16/2020   HCT 48.0 07/16/2020   MCV 92 07/16/2020   PLT 260 07/16/2020   Lab Results  Component Value Date   NA 139 07/16/2020   K 4.5 07/16/2020   CO2 25 07/16/2020   GLUCOSE 118 (H) 07/16/2020   BUN 13 07/16/2020   CREATININE 0.82 07/16/2020   BILITOT 0.4 07/16/2020   ALKPHOS 87 07/16/2020   AST 24 07/16/2020   ALT 29 07/16/2020   PROT 6.7 07/16/2020   ALBUMIN 4.6 07/16/2020   CALCIUM 9.8 07/16/2020   Lab Results  Component Value Date   CHOL 170 02/05/2021   Lab Results  Component Value Date   HDL 39 (L) 02/05/2021   Lab Results  Component Value Date   LDLCALC 73 02/05/2021   Lab Results  Component Value Date   TRIG 363 (H) 02/05/2021   Lab Results  Component Value Date   CHOLHDL 4.4 02/05/2021   Lab Results  Component Value Date   HGBA1C 6.9 01/15/2021       Assessment & Plan:   Problem List Items Addressed This Visit       Cardiovascular and Mediastinum   Essential hypertension - Primary    Hypertension not well controlled.  Patient currently on Cardizem 120 mg tablet by mouth every day.  Patient has not had hydrochlorothiazide 25 mg tablet by mouth daily in  the past few months as prescribed.  Patient reports he has been out of medication and has not had a refill.  Patient was at work today and blood pressure was very elevated and he was sent back home.  In clinic blood pressure is 198/95.  I provided education to patient on prevention of stroke due to extreme high blood pressures.  II Started patient back on hydrochlorothiazide 25 mg tablet by mouth daily.  Encourage patient to take a daily blood pressure log, follow-up in 3 days by phone with blood pressure log and in 2 weeks office visit to reassess blood pressure medications therapeutic effect.  Patient verbalized understanding.  Recommendation-exercise, weight loss, low-sodium diet.  Rx sent to pharmacy.        Relevant Medications   hydrochlorothiazide (HYDRODIURIL) 25 MG tablet   VASCEPA 1 g capsule     Other   Hyperlipidemia    No new symptoms of hyperlipidemia.  Patient consistently takes Crestor 40 mg tablet by mouth daily.  And has not taking Vascepa 1 g [2 capsules by mouth twice daily] in the past few months due to being out of medication.  Education provided to patient printed handouts given.  Rx refill sent to pharmacy.  Advised to follow-up.      Relevant Medications   hydrochlorothiazide (HYDRODIURIL) 25 MG tablet   VASCEPA 1 g capsule   Dizziness    Patient has experienced dizziness in the past intermittently but has recently experienced more symptoms in the past few days.  Patient continues to do Epley's maneuver with very mild to moderate therapeutic effect.  Started patient on 12.5 mg meclizine as  needed.  Follow-up with worsening unresolved symptoms.  Education provided to patient printed handouts given.  Rx sent to pharmacy.      Relevant Medications   meclizine (ANTIVERT) 12.5 MG tablet   Other Visit Diagnoses     Hypertension associated with diabetes ([place])       Relevant Medications   hydrochlorothiazide (HYDRODIURIL) 25 MG tablet   VASCEPA 1 g capsule    Hyperlipidemia associated with type 2 diabetes mellitus ([facility])       Relevant Medications   hydrochlorothiazide (HYDRODIURIL) 25 MG tablet   VASCEPA 1 g capsule   Hypertriglyceridemia       Relevant Medications   hydrochlorothiazide (HYDRODIURIL) 25 MG tablet   VASCEPA 1 g capsule        Meds ordered this encounter  Medications   hydrochlorothiazide (HYDRODIURIL) 25 MG tablet    Sig: Take 1 tablet (25 mg total) by mouth daily.    Dispense:  90 tablet    Refill:  3    Order Specific Question:   Supervising Provider    Answer:   Jeneen Rinks   VASCEPA 1 g capsule    Sig: Take 2 capsules (2 g total) by mouth 2 (two) times daily.    Dispense:  120 capsule    Refill:  12    Order Specific Question:   Supervising Provider    Answer:   Jeneen Rinks   meclizine (ANTIVERT) 12.5 MG tablet    Sig: Take 1 tablet (12.5 mg total) by mouth 3 (three) times daily as needed for dizziness.    Dispense:  30 tablet    Refill:  0    Order Specific Question:   Supervising Provider    Answer:   Claretta Fraise [[id number]]      Ivy Lynn, NP

## 2021-07-22 NOTE — Assessment & Plan Note (Signed)
No new symptoms of hyperlipidemia.  Patient consistently takes Crestor 40 mg tablet by mouth daily.  And has not taking Vascepa 1 g [2 capsules by mouth twice daily] in the past few months due to being out of medication.  Education provided to patient printed handouts given.  Rx refill sent to pharmacy.  Advised to follow-up.

## 2021-07-22 NOTE — Patient Instructions (Addendum)
High Cholesterol High cholesterol is a condition in which the blood has high levels of a white, waxy substance similar to fat (cholesterol). The liver makes all the cholesterol that the body needs. The human body needs small amounts of cholesterol to help build cells. A person gets extra or excess cholesterol from the food that he or she eats. The blood carries cholesterol from the liver to the rest of the body. If you have high cholesterol, deposits (plaques) may build up on the walls of your arteries. Arteries are the blood vessels that carry blood away from your heart. These plaques make the arteries narrow and stiff. Cholesterol plaques increase your risk for heart attack and stroke. Work with your health care provider to keep your cholesterol levels in a healthy range. What increases the risk? The following factors may make you more likely to develop this condition: Eating foods that are high in animal fat (saturated fat) or cholesterol. Being overweight. Not getting enough exercise. A family history of high cholesterol (familial hypercholesterolemia). Use of tobacco products. Having diabetes. What are the signs or symptoms? In most cases, high cholesterol does not usually cause any symptoms. In severe cases, very high cholesterol levels can cause: Fatty bumps under the skin (xanthomas). A white or gray ring around the black center (pupil) of the eye. How is this diagnosed? This condition may be diagnosed based on the results of a blood test. If you are older than 60 years of age, your health care provider may check your cholesterol levels every 4-6 years. You may be checked more often if you have high cholesterol or other risk factors for heart disease. The blood test for cholesterol measures: "Bad" cholesterol, or LDL cholesterol. This is the main type of cholesterol that causes heart disease. The desired level is less than 100 mg/dL (2.59 mmol/L). "Good" cholesterol, or HDL  cholesterol. HDL helps protect against heart disease by cleaning the arteries and carrying the LDL to the liver for processing. The desired level for HDL is 60 mg/dL (1.55 mmol/L) or higher. Triglycerides. These are fats that your body can store or burn for energy. The desired level is less than 150 mg/dL (1.69 mmol/L). Total cholesterol. This measures the total amount of cholesterol in your blood and includes LDL, HDL, and triglycerides. The desired level is less than 200 mg/dL (5.17 mmol/L). How is this treated? Treatment for high cholesterol starts with lifestyle changes, such as diet and exercise. Diet changes. You may be asked to eat foods that have more fiber and less saturated fats or added sugar. Lifestyle changes. These may include regular exercise, maintaining a healthy weight, and quitting use of tobacco products. Medicines. These are given when diet and lifestyle changes have not worked. You may be prescribed a statin medicine to help lower your cholesterol levels. Follow these instructions at home: Eating and drinking  Eat a healthy, balanced diet. This diet includes: Daily servings of a variety of fresh, frozen, or canned fruits and vegetables. Daily servings of whole grain foods that are rich in fiber. Foods that are low in saturated fats and trans fats. These include poultry and fish without skin, lean cuts of meat, and low-fat dairy products. A variety of fish, especially oily fish that contain omega-3 fatty acids. Aim to eat fish at least 2 times a week. Avoid foods and drinks that have added sugar. Use healthy cooking methods, such as roasting, grilling, broiling, baking, poaching, steaming, and stir-frying. Do not fry your food except for stir-frying.  If you drink alcohol: Limit how much you have to: 0-1 drink a day for women who are not pregnant. 0-2 drinks a day for men. Know how much alcohol is in a drink. In the [place] one drink equals one 12 oz bottle of beer (355 mL),  one 5 oz glass of wine (148 mL), or one 1 oz glass of hard liquor (44 mL). Lifestyle  Get regular exercise. Aim to exercise for a total of 150 minutes a week. Increase your activity level by doing activities such as gardening, walking, and taking the stairs. Do not use any products that contain nicotine or tobacco. These products include cigarettes, chewing tobacco, and vaping devices, such as e-cigarettes. If you need help quitting, ask your health care provider. General instructions Take over-the-counter and prescription medicines only as told by your health care provider. Keep all follow-up visits. This is important. Where to find more information American Heart Association: www.heart.org National Heart, Lung, and Blood Institute: https://wilson-eaton.com/ Contact a health care provider if: You have trouble achieving or maintaining a healthy diet or weight. You are starting an exercise program. You are unable to stop smoking. Get help right away if: You have chest pain. You have trouble breathing. You have discomfort or pain in your jaw, neck, back, shoulder, or arm. You have any symptoms of a stroke. "BE FAST" is an easy way to remember the main warning signs of a stroke: B - Balance. Signs are dizziness, sudden trouble walking, or loss of balance. E - Eyes. Signs are trouble seeing or a sudden change in vision. F - Face. Signs are sudden weakness or numbness of the face, or the face or eyelid drooping on one side. A - Arms. Signs are weakness or numbness in an arm. This happens suddenly and usually on one side of the body. S - Speech. Signs are sudden trouble speaking, slurred speech, or trouble understanding what people say. T - Time. Time to call emergency services. Write down what time symptoms started. You have other signs of a stroke, such as: A sudden, severe headache with no known cause. Nausea or vomiting. Seizure. These symptoms may represent a serious problem that is an  emergency. Do not wait to see if the symptoms will go away. Get medical help right away. Call your local emergency services (911 in the [place] Do not drive yourself to the hospital. Summary Cholesterol plaques increase your risk for heart attack and stroke. Work with your health care provider to keep your cholesterol levels in a healthy range. Eat a healthy, balanced diet, get regular exercise, and maintain a healthy weight. Do not use any products that contain nicotine or tobacco. These products include cigarettes, chewing tobacco, and vaping devices, such as e-cigarettes. Get help right away if you have any symptoms of a stroke. This information is not intended to replace advice given to you by your health care provider. Make sure you discuss any questions you have with your health care provider. Document Revised: 11/22/2020 Document Reviewed: 11/12/2020 Elsevier Patient Education  2022 [place]. Hypertension, Adult Hypertension is another name for high blood pressure. High blood pressure forces your heart to work harder to pump blood. This can cause problems over time. There are two numbers in a blood pressure reading. There is a top number (systolic) over a bottom number (diastolic). It is best to have a blood pressure that is below 120/80. Healthy choices can help lower your blood pressure, or you may need medicine to help lower  it. What are the causes? The cause of this condition is not known. Some conditions may be related to high blood pressure. What increases the risk? Smoking. Having type 2 diabetes mellitus, high cholesterol, or both. Not getting enough exercise or physical activity. Being overweight. Having too much fat, sugar, calories, or salt (sodium) in your diet. Drinking too much alcohol. Having long-term (chronic) kidney disease. Having a family history of high blood pressure. Age. Risk increases with age. Race. You may be at higher risk if you are African  American. Gender. Men are at higher risk than women before age 25. After age 55, women are at higher risk than men. Having obstructive sleep apnea. Stress. What are the signs or symptoms? High blood pressure may not cause symptoms. Very high blood pressure (hypertensive crisis) may cause: Headache. Feelings of worry or nervousness (anxiety). Shortness of breath. Nosebleed. A feeling of being sick to your stomach (nausea). Throwing up (vomiting). Changes in how you see. Very bad chest pain. Seizures. How is this treated? This condition is treated by making healthy lifestyle changes, such as: Eating healthy foods. Exercising more. Drinking less alcohol. Your health care provider may prescribe medicine if lifestyle changes are not enough to get your blood pressure under control, and if: Your top number is above 130. Your bottom number is above 80. Your personal target blood pressure may vary. Follow these instructions at home: Eating and drinking  If told, follow the DASH eating plan. To follow this plan: Fill one half of your plate at each meal with fruits and vegetables. Fill one fourth of your plate at each meal with whole grains. Whole grains include whole-wheat pasta, brown rice, and whole-grain bread. Eat or drink low-fat dairy products, such as skim milk or low-fat yogurt. Fill one fourth of your plate at each meal with low-fat (lean) proteins. Low-fat proteins include fish, chicken without skin, eggs, beans, and tofu. Avoid fatty meat, cured and processed meat, or chicken with skin. Avoid pre-made or processed food. Eat less than 1,500 mg of salt each day. Do not drink alcohol if: Your doctor tells you not to drink. You are pregnant, may be pregnant, or are planning to become pregnant. If you drink alcohol: Limit how much you use to: 0-1 drink a day for women. 0-2 drinks a day for men. Be aware of how much alcohol is in your drink. In the [place] one drink equals one 12  oz bottle of beer (355 mL), one 5 oz glass of wine (148 mL), or one 1 oz glass of hard liquor (44 mL). Lifestyle  Work with your doctor to stay at a healthy weight or to lose weight. Ask your doctor what the best weight is for you. Get at least 30 minutes of exercise most days of the week. This may include walking, swimming, or biking. Get at least 30 minutes of exercise that strengthens your muscles (resistance exercise) at least 3 days a week. This may include lifting weights or doing Pilates. Do not use any products that contain nicotine or tobacco, such as cigarettes, e-cigarettes, and chewing tobacco. If you need help quitting, ask your doctor. Check your blood pressure at home as told by your doctor. Keep all follow-up visits as told by your doctor. This is important. Medicines Take over-the-counter and prescription medicines only as told by your doctor. Follow directions carefully. Do not skip doses of blood pressure medicine. The medicine does not work as well if you skip doses. Skipping doses also puts  you at risk for problems. Ask your doctor about side effects or reactions to medicines that you should watch for. Contact a doctor if you: Think you are having a reaction to the medicine you are taking. Have headaches that keep coming back (recurring). Feel dizzy. Have swelling in your ankles. Have trouble with your vision. Get help right away if you: Get a very bad headache. Start to feel mixed up (confused). Feel weak or numb. Feel faint. Have very bad pain in your: Chest. Belly (abdomen). Throw up more than once. Have trouble breathing. Summary Hypertension is another name for high blood pressure. High blood pressure forces your heart to work harder to pump blood. For most people, a normal blood pressure is less than 120/80. Making healthy choices can help lower blood pressure. If your blood pressure does not get lower with healthy choices, you may need to take  medicine. This information is not intended to replace advice given to you by your health care provider. Make sure you discuss any questions you have with your health care provider. Document Revised: 05/19/2018 Document Reviewed: 05/19/2018 Elsevier Patient Education  [place].

## 2021-07-22 NOTE — Assessment & Plan Note (Signed)
Hypertension not well controlled.  Patient currently on Cardizem 120 mg tablet by mouth every day.  Patient has not had hydrochlorothiazide 25 mg tablet by mouth daily in the past few months as prescribed.  Patient reports he has been out of medication and has not had a refill.  Patient was at work today and blood pressure was very elevated and he was sent back home.  In clinic blood pressure is 198/95.  I provided education to patient on prevention of stroke due to extreme high blood pressures.  II Started patient back on hydrochlorothiazide 25 mg tablet by mouth daily.  Encourage patient to take a daily blood pressure log, follow-up in 3 days by phone with blood pressure log and in 2 weeks office visit to reassess blood pressure medications therapeutic effect.  Patient verbalized understanding.  Recommendation-exercise, weight loss, low-sodium diet.  Rx sent to pharmacy.

## 2021-07-22 NOTE — Assessment & Plan Note (Signed)
Patient has experienced dizziness in the past intermittently but has recently experienced more symptoms in the past few days.  Patient continues to do Epley's maneuver with very mild to moderate therapeutic effect.  Started patient on 12.5 mg meclizine as needed.  Follow-up with worsening unresolved symptoms.  Education provided to patient printed handouts given.  Rx sent to pharmacy.

## 2021-07-24 ENCOUNTER — Encounter: Payer: Self-pay | Admitting: Family Medicine

## 2021-07-24 ENCOUNTER — Other Ambulatory Visit: Payer: Self-pay | Admitting: Family Medicine

## 2021-07-24 MED ORDER — DILTIAZEM HCL 120 MG PO TABS: 120.0000 mg | ORAL_TABLET | Freq: Two times a day (BID) | ORAL | 1 refills | Status: DC

## 2021-07-24 NOTE — Telephone Encounter (Signed)
Called and spoke with patient. He verbalized understanding and has follo up with Dr. Darnell Level in 2 weeks

## 2021-07-24 NOTE — Telephone Encounter (Signed)
Yes, ok to double the cardizem.  Please make sure you are sending a new rx to reflect new dose.  Also, please make sure he has follow up with nurse in 2 weeks for bP check and in 1 month with me for general follow up

## 2021-07-25 DIAGNOSIS — E1369 Other specified diabetes mellitus with other specified complication: Secondary | ICD-10-CM | POA: Diagnosis not present

## 2021-07-25 DIAGNOSIS — E785 Hyperlipidemia, unspecified: Secondary | ICD-10-CM | POA: Diagnosis not present

## 2021-07-25 DIAGNOSIS — I10 Essential (primary) hypertension: Secondary | ICD-10-CM | POA: Diagnosis not present

## 2021-07-25 DIAGNOSIS — R42 Dizziness and giddiness: Secondary | ICD-10-CM | POA: Diagnosis not present

## 2021-07-25 DIAGNOSIS — H538 Other visual disturbances: Secondary | ICD-10-CM | POA: Diagnosis not present

## 2021-07-25 DIAGNOSIS — F17210 Nicotine dependence, cigarettes, uncomplicated: Secondary | ICD-10-CM | POA: Diagnosis not present

## 2021-08-02 ENCOUNTER — Encounter: Payer: Self-pay | Admitting: Family Medicine

## 2021-08-02 ENCOUNTER — Ambulatory Visit: Payer: BC Managed Care – PPO | Admitting: Family Medicine

## 2021-08-02 VITALS — BP 144/90 | HR 76 | Temp 98.5°F | Ht 68.0 in | Wt 210.0 lb

## 2021-08-02 DIAGNOSIS — I152 Hypertension secondary to endocrine disorders: Secondary | ICD-10-CM | POA: Diagnosis not present

## 2021-08-02 DIAGNOSIS — E099 Drug or chemical induced diabetes mellitus without complications: Secondary | ICD-10-CM | POA: Diagnosis not present

## 2021-08-02 DIAGNOSIS — J019 Acute sinusitis, unspecified: Secondary | ICD-10-CM

## 2021-08-02 DIAGNOSIS — E11649 Type 2 diabetes mellitus with hypoglycemia without coma: Secondary | ICD-10-CM

## 2021-08-02 DIAGNOSIS — T380X5A Adverse effect of glucocorticoids and synthetic analogues, initial encounter: Secondary | ICD-10-CM | POA: Diagnosis not present

## 2021-08-02 DIAGNOSIS — E1159 Type 2 diabetes mellitus with other circulatory complications: Secondary | ICD-10-CM

## 2021-08-02 DIAGNOSIS — B9689 Other specified bacterial agents as the cause of diseases classified elsewhere: Secondary | ICD-10-CM

## 2021-08-02 LAB — BAYER DCA HB A1C WAIVED: HB A1C (BAYER DCA - WAIVED): 8.1 % — ABNORMAL HIGH (ref 4.8–5.6)

## 2021-08-02 MED ORDER — AMOXICILLIN-POT CLAVULANATE 875-125 MG PO TABS: 1.0000 | ORAL_TABLET | Freq: Two times a day (BID) | ORAL | 0 refills | Status: DC

## 2021-08-02 MED ORDER — EMPAGLIFLOZIN 10 MG PO TABS: 10.0000 mg | ORAL_TABLET | Freq: Every day | ORAL | 0 refills | Status: DC

## 2021-08-02 MED ORDER — BENZONATATE 100 MG PO CAPS
100.0000 mg | ORAL_CAPSULE | Freq: Three times a day (TID) | ORAL | 0 refills | Status: DC | PRN
Start: 2021-08-02 — End: 2021-11-08

## 2021-08-02 NOTE — Progress Notes (Signed)
Subjective: CC: HTN, DM PCP: Raliegh Ip, DO Kenneth Fields is a 60 y.o. male presenting to clinic today for:  1. Type 2 Diabetes with hypertension, hyperlipidemia:  Patient is compliant with metformin, Crestor, benazepril, Cardizem and hydrochlorothiazide.  He apparently had an episode where he became very dizzy and was noted to have elevated blood pressures into the 180s over 118's at work.  He was subsequently advised to see his PCP.  He had been doing Epley maneuvers on his own for the vertigo and this seemed to temporarily help but did not resolve.  He notes that during these times he was having some visual disturbance as well and is subsequently made a visit with his neurologist.  He had imaging done in the emergency department and this was negative for evidence of stroke.  He has since resumed use of hydrochlorothiazide and blood pressures do seem to be a lot better.  He is not being treated with oral corticosteroids currently but admits that his blood sugars have been quite elevated anywhere from 190s to 240s at home.  He had not been checking his blood sugars up until this time because his last A1c was under excellent control.  Last eye exam: UTD Last foot exam: UTD Last A1c:  Lab Results  Component Value Date   HGBA1C 6.9 01/15/2021   Nephropathy screen indicated?: on ACE-I Last flu, zoster and/or pneumovax:  Immunization History  Administered Date(s) Administered   Janssen (J&J) SARS-COV-2 Vaccination 01/23/2020   Pneumococcal Polysaccharide-23 07/23/2014   Tdap 09/22/2009, 07/13/2010, 02/14/2013    ROS: Per HPI  Allergies  Allergen Reactions   Bee Venom Anaphylaxis   Chantix [Varenicline Tartrate]     "mental problems"   Past Medical History:  Diagnosis Date   COVID-19 08/2020   High cholesterol    Hypertension    Myasthenia gravis (HCC)    Myasthenia gravis (HCC)     Current Outpatient Medications:    benazepril (LOTENSIN) 40 MG tablet, TAKE 1  TABLET BY MOUTH EVERY DAY, Disp: 90 tablet, Rfl: 1   cetirizine (ZYRTEC) 10 MG tablet, Take 10 mg by mouth daily as needed for allergies., Disp: , Rfl:    diltiazem (CARDIZEM CD) 120 MG 24 hr capsule, TAKE 1 CAPSULE BY MOUTH EVERY DAY, Disp: 90 capsule, Rfl: 1   diltiazem (CARDIZEM) 120 MG tablet, Take 1 tablet (120 mg total) by mouth in the morning and at bedtime., Disp: 180 tablet, Rfl: 1   hydrochlorothiazide (HYDRODIURIL) 25 MG tablet, Take 1 tablet (25 mg total) by mouth daily., Disp: 90 tablet, Rfl: 3   meclizine (ANTIVERT) 12.5 MG tablet, Take 1 tablet (12.5 mg total) by mouth 3 (three) times daily as needed for dizziness., Disp: 30 tablet, Rfl: 0   meloxicam (MOBIC) 15 MG tablet, Take 0.5-1 tablets (7.5-15 mg total) by mouth daily as needed for pain. (Patient not taking: Reported on 07/22/2021), Disp: 30 tablet, Rfl: 0   mycophenolate (CELLCEPT) 500 MG tablet, TAKE 2 TABLETS BY MOUTH TWICE DAILY, Disp: 120 tablet, Rfl: 1   pantoprazole (PROTONIX) 40 MG tablet, TAKE 1 TABLET BY MOUTH EVERY DAY, Disp: 90 tablet, Rfl: 1   pyridostigmine (MESTINON) 60 MG tablet, Take 0.5-1 tablets (30-60 mg total) by mouth 3 (three) times daily as needed., Disp: 270 tablet, Rfl: 4   rosuvastatin (CRESTOR) 40 MG tablet, Take 1 tablet (40 mg total) by mouth daily., Disp: 90 tablet, Rfl: 3   VASCEPA 1 g capsule, Take 2 capsules (2 g total)  by mouth 2 (two) times daily., Disp: 120 capsule, Rfl: 12 Social History   Socioeconomic History   Marital status: Married    Spouse name: Not on file   Number of children: Not on file   Years of education: Not on file   Highest education level: 11th grade  Occupational History   Not on file  Tobacco Use   Smoking status: Every Day    Packs/day: 1.00    Years: 40.00    Pack years: 40.00    Types: Cigarettes   Smokeless tobacco: Never  Vaping Use   Vaping Use: Never used  Substance and Sexual Activity   Alcohol use: Yes    Alcohol/week: 14.0 - 21.0 standard drinks     Types: 14 - 21 Cans of beer per week    Comment: 3 beers per night   Drug use: Never   Sexual activity: Not on file  Other Topics Concern   Not on file  Social History Narrative   Live at home with his wife   Right handed   Caffeine: 1 diet mtn dew daily, occasional tea (decaf), 1 cup of coffee in the mornings (no coffee when it's warm outside)   Social Determinants of Health   Financial Resource Strain: Not on file  Food Insecurity: Not on file  Transportation Needs: Not on file  Physical Activity: Not on file  Stress: Not on file  Social Connections: Not on file  Intimate Partner Violence: Not on file   Family History  Problem Relation Age of Onset   Lung cancer Mother    Cancer Mother     Objective: Office vital signs reviewed. BP (!) 144/90   Pulse 76   Temp 98.5 F (36.9 C)   Ht $R'5\' 8"'Gw$  (1.727 m)   Wt 210 lb (95.3 kg)   SpO2 93%   BMI 31.93 kg/m   Physical Examination:  General: Awake, alert, well nourished, No acute distress HEENT: Normal; sclera white.  Moist mucous membranes.  TMs intact bilaterally. Cardio: regular rate and rhythm, S1S2 heard, no murmurs appreciated Pulm: clear to auscultation bilaterally, no wheezes, rhonchi or rales; normal work of breathing on room air   Lab Results  Component Value Date   HGBA1C 6.9 01/15/2021    Assessment/ Plan: 60 y.o. male   Uncontrolled type 2 diabetes mellitus with hypoglycemia without coma ([place]) - Plan: CMP14+EGFR, Bayer DCA Hb A1c Waived, empagliflozin (JARDIANCE) 10 MG TABS tablet  Hypertension associated with diabetes ([place]) - Plan: CMP14+EGFR  Acute bacterial sinusitis - Plan: amoxicillin-clavulanate (AUGMENTIN) 875-125 MG tablet, benzonatate (TESSALON PERLES) 100 MG capsule  Sugar now uncontrolled with A1c rising to 8.1.  Continue metformin at current dose organ to add some Jardiance.  I given him 4 weeks worth of samples.  He will contact me in the next couple weeks to let me know how he is  tolerating and we will plan to advance him to the full 25 mg.  He will call to schedule follow-up visit in 3 months.  Would like to repeat A1c at that time  Blood pressure not at goal but certainly better than what he had been seeing.  I would like to see how this new diabetes medication impacts his blood pressure as there is some evidence of systolic reduction with this class.  He will continue monitoring blood pressures daily and contact me if he has persistent elevations above 140/90.  I am also empirically treating him for an acute bacterial sinusitis given ongoing  symptoms and reports of dizziness.  He needs to keep follow-up with his neurologist to ensure that the most recent dizziness and visual disturbances that he has been experiencing are not related to his underlying neurologic disease.  No orders of the defined types were placed in this encounter.  No orders of the defined types were placed in this encounter.    [name], DO [place] [phone]

## 2021-08-03 ENCOUNTER — Other Ambulatory Visit: Payer: Self-pay | Admitting: Family Medicine

## 2021-08-03 DIAGNOSIS — K219 Gastro-esophageal reflux disease without esophagitis: Secondary | ICD-10-CM

## 2021-08-03 LAB — CMP14+EGFR
ALT: 25 IU/L (ref 0–44)
AST: 23 IU/L (ref 0–40)
Albumin/Globulin Ratio: 2 (ref 1.2–2.2)
Albumin: 4.5 g/dL (ref 3.8–4.9)
Alkaline Phosphatase: 108 IU/L (ref 44–121)
BUN/Creatinine Ratio: 16 (ref 10–24)
BUN: 16 mg/dL (ref 8–27)
Bilirubin Total: 0.4 mg/dL (ref 0.0–1.2)
CO2: 23 mmol/L (ref 20–29)
Calcium: 9.7 mg/dL (ref 8.6–10.2)
Chloride: 99 mmol/L (ref 96–106)
Creatinine, Ser: 1 mg/dL (ref 0.76–1.27)
Globulin, Total: 2.3 g/dL (ref 1.5–4.5)
Glucose: 165 mg/dL — ABNORMAL HIGH (ref 70–99)
Potassium: 4.3 mmol/L (ref 3.5–5.2)
Sodium: 137 mmol/L (ref 134–144)
Total Protein: 6.8 g/dL (ref 6.0–8.5)
eGFR: 86 mL/min/{1.73_m2} (ref >59)

## 2021-08-09 ENCOUNTER — Other Ambulatory Visit: Payer: Self-pay | Admitting: Family Medicine

## 2021-08-26 ENCOUNTER — Ambulatory Visit: Payer: BC Managed Care – PPO | Admitting: Family Medicine

## 2021-08-27 ENCOUNTER — Encounter: Payer: Self-pay | Admitting: Neurology

## 2021-08-27 ENCOUNTER — Ambulatory Visit: Payer: BC Managed Care – PPO | Admitting: Neurology

## 2021-08-27 ENCOUNTER — Telehealth: Payer: Self-pay | Admitting: Neurology

## 2021-08-27 VITALS — Ht 69.0 in | Wt 206.0 lb

## 2021-08-27 DIAGNOSIS — J0100 Acute maxillary sinusitis, unspecified: Secondary | ICD-10-CM

## 2021-08-27 DIAGNOSIS — R42 Dizziness and giddiness: Secondary | ICD-10-CM | POA: Diagnosis not present

## 2021-08-27 DIAGNOSIS — H8110 Benign paroxysmal vertigo, unspecified ear: Secondary | ICD-10-CM | POA: Diagnosis not present

## 2021-08-27 DIAGNOSIS — H938X3 Other specified disorders of ear, bilateral: Secondary | ICD-10-CM

## 2021-08-27 MED ORDER — MECLIZINE HCL 25 MG PO TABS: 25.0000 mg | ORAL_TABLET | Freq: Three times a day (TID) | ORAL | 6 refills | Status: AC | PRN

## 2021-08-27 MED ORDER — ONDANSETRON HCL 8 MG PO TABS: 8.0000 mg | ORAL_TABLET | Freq: Three times a day (TID) | ORAL | 3 refills | Status: DC | PRN

## 2021-08-27 NOTE — Patient Instructions (Addendum)
Ondansetron and meclizine for dizziness/BPPV Call [facility] for aponitment - Vestibular therapy Start using flonase or OTC steroid nasal spay !!!! daily Consider ENT because all this congestion may be causing fluid behind behind the ears and inflammation of the inner ear (Labyrinthitis or  neuronitis)- referal sent Zyrtec-D (pharmacy) 2x a dayfor 7-10 days Netti pot - use distilled water  Myasthenia Gravis and arm is better continue treatment   Benign Positional Vertigo Vertigo is the feeling that you or your surroundings are moving when they are not. Benign positional vertigo is the most common form of vertigo. This is usually a harmless condition (benign). This condition is positional. This means that symptoms are triggered by certain movements and positions. This condition can be dangerous if it occurs while you are doing something that could cause harm to yourself or others. This includes activities such as driving or operating machinery. What are the causes? The inner ear has fluid-filled canals that help your brain sense movement and balance. When the fluid moves, the brain receives messages about your body's position. With benign positional vertigo, calcium crystals in the inner ear break free and disturb the inner ear area. This causes your brain to receive confusing messages about your body's position. What increases the risk? You are more likely to develop this condition if: You are a woman. You are 40 years of age or older. You have recently had a head injury. You have an inner ear disease. What are the signs or symptoms? Symptoms of this condition usually happen when you move your head or your eyes in different directions. Symptoms may start suddenly and usually last for less than a minute. They include: Loss of balance and falling. Feeling like you are spinning or moving. Feeling like your surroundings are spinning or moving. Nausea and vomiting. Blurred  vision. Dizziness. Involuntary eye movement (nystagmus). Symptoms can be mild and cause only minor problems, or they can be severe and interfere with daily life. Episodes of benign positional vertigo may return (recur) over time. Symptoms may also improve over time. How is this diagnosed? This condition may be diagnosed based on: Your medical history. A physical exam of the head, neck, and ears. Positional tests to check for or stimulate vertigo. You may be asked to turn your head and change positions, such as going from sitting to lying down. A health care provider will watch for symptoms of vertigo. You may be referred to a health care provider who specializes in ear, nose, and throat problems (ENT or otolaryngologist) or a provider who specializes in disorders of the nervous system (neurologist). How is this treated? This condition may be treated in a session in which your health care provider moves your head in specific positions to help the displaced crystals in your inner ear move. Treatment for this condition may take several sessions. Surgery may be needed in severe cases, but this is rare. In some cases, benign positional vertigo may resolve on its own in 2-4 weeks. Follow these instructions at home: Safety Move slowly. Avoid sudden body or head movements or certain positions, as told by your health care provider. Avoid driving or operating machinery until your health care provider says it is safe. Avoid doing any tasks that would be dangerous to you or others if vertigo occurs. If you have trouble walking or keeping your balance, try using a cane for stability. If you feel dizzy or unstable, sit down right away. Return to your normal activities as told by your  health care provider. Ask your health care provider what activities are safe for you. General instructions Take over-the-counter and prescription medicines only as told by your health care provider. Drink enough fluid to keep  your urine pale yellow. Keep all follow-up visits. This is important. Contact a health care provider if: You have a fever. Your condition gets worse or you develop new symptoms. Your family or friends notice any behavioral changes. You have nausea or vomiting that gets worse. You have numbness or a prickling and tingling sensation. Get help right away if you: Have difficulty speaking or moving. Are always dizzy or faint. Develop severe headaches. Have weakness in your legs or arms. Have changes in your hearing or vision. Develop a stiff neck. Develop sensitivity to light. These symptoms may represent a serious problem that is an emergency. Do not wait to see if the symptoms will go away. Get medical help right away. Call your local emergency services (911 in the [place] Do not drive yourself to the hospital. Summary Vertigo is the feeling that you or your surroundings are moving when they are not. Benign positional vertigo is the most common form of vertigo. This condition is caused by calcium crystals in the inner ear that become displaced. This causes a disturbance in an area of the inner ear that helps your brain sense movement and balance. Symptoms include loss of balance and falling, feeling that you or your surroundings are moving, nausea and vomiting, and blurred vision. This condition can be diagnosed based on symptoms, a physical exam, and positional tests. Follow safety instructions as told by your health care provider and keep all follow-up visits. This is important. This information is not intended to replace advice given to you by your health care provider. Make sure you discuss any questions you have with your health care provider. Document Revised: 08/08/2020 Document Reviewed: 08/08/2020 Elsevier Patient Education  2022 [place].  Labyrinthitis Labyrinthitis is an inner ear infection. The inner ear is a system of tubes and canals (labyrinth) that are filled with fluid.  The inner ear also contains nerve cells that send hearing and balance signals to the brain. When tiny germs (microorganisms) get inside the labyrinth, they harm the cells that send messages to the brain. This can cause changes in hearing and balance. Labyrinthitis usually develops suddenly and goes away with treatment in a few weeks (acute labyrinthitis). If the infection damages parts of the labyrinth, some symptoms may last for a long time (chronic labyrinthitis). What are the causes? Labyrinthitis can be caused by viruses, such as one that causes: Infectious mononucleosis, also called mono. Measles or mumps. The flu (influenza). Herpes. Labyrinthitis can also be caused by bacteria that spread from an infection in the brain or the middle ear (suppurative labyrinthitis). In some cases, the bacteria may produce a poison (toxin) that gets inside the labyrinth (serous labyrinthitis). What increases the risk? You may be at greater risk for labyrinthitis if you: Recently had a mouth, nose, or throat infection (upper respiratory infection) or an ear infection. Drink a lot of alcohol. Smoke. Use certain drugs. Are feeling tired (fatigued). Are experiencing a lot of stress. Have allergies. What are the signs or symptoms? Symptoms of labyrinthitis usually start suddenly. The symptoms may range from mild to severe, and may include: Dizziness. Hearing loss. A feeling that you or your surroundings are moving when they are not (vertigo). Ringing in your ear (tinnitus). Nausea and vomiting. Trouble focusing your eyes. Symptoms of chronic labyrinthitis may  include: Fatigue. Confusion. Hearing loss. Tinnitus. Poor balance. Vertigo after sudden head movements. How is this diagnosed? This condition may be diagnosed based on: Your symptoms and medical history. Your health care provider may ask about any dizziness or hearing loss you have and any recent upper respiratory infections. A physical exam  that involves: Checking your ears for infection. Testing your balance. Checking your eye movement. Hearing tests. Imaging tests, such as a CT scan or an MRI. Tests of your eye movements (electronystagmogram, or ENG). How is this treated? Treatment depends on the cause. If your condition is caused by bacteria, you may need antibiotic medicine. If it is caused by a virus, it may get better on its own. Regardless of the cause, you may be treated with: Medicines to: Stop dizziness. Relieve nausea. Reduce inflammation. Speed your recovery. IV fluids. These may be given at a hospital. You may need IV fluids if you have severe nausea and vomiting. Physical therapy. A therapist can teach you exercises to help you adjust to feeling dizzy (vestibular rehabilitation exercises). You may need this if you have dizziness that does not go away. Follow these instructions at home: Medicines Take over-the-counter and prescription medicines only as told by your health care provider. If you were prescribed an antibiotic medicine, take it as told by your health care provider. Do not stop taking the antibiotic even if you start to feel better. Activity Rest as told by your health care provider. Limit your activity as directed. Ask your health care provider what activities are safe for you. Do not make sudden movements until any dizziness goes away. If physical therapy was prescribed, do exercises as directed. General instructions Avoid loud noises and bright lights. Do not drive until your health care provider says that this is safe for you. Drink enough fluid to keep your urine pale yellow. Keep all follow-up visits. This is important. Contact a health care provider if you have: Symptoms that do not get better with medicine. Symptoms that last longer than 2 weeks. A fever. Get help right away if you have: Nausea or vomiting that is severe or does not go away. Severe dizziness. Sudden hearing  loss. Summary Labyrinthitis is an infection of the inner ear. It can cause changes in hearing and balance or vertigo. Symptoms usually start suddenly and include dizziness, hearing loss, nausea, and vomiting. You may also have ringing in your ear (tinnitus), trouble focusing your eyes, and vertigo. If the condition lasts more than a few weeks, symptoms may include fatigue, confusion, hearing loss, poor balance, tinnitus, and vertigo. Treatment depends on the cause. If your labyrinthitis is caused by bacteria, you may need antibiotic medicine. If your labyrinthitis is caused by a virus, it may get better on its own. Follow your health care provider's instructions, including how to take medicines, what activities to avoid, and when to get medical help. This information is not intended to replace advice given to you by your health care provider. Make sure you discuss any questions you have with your health care provider. Document Revised: 10/17/2020 Document Reviewed: 10/17/2020 Elsevier Patient Education  2022 [employer].

## 2021-08-27 NOTE — Telephone Encounter (Signed)
I spoke with the patient.  He states a friend just told him this afternoon about Dr Benjamine Mola not having an office location in [place].  He stated he will talk to his wife when she gets home from work and see if they can find another office.  He will get back in touch with [place] via MyChart or phone call.  He verbalized appreciation for the call.

## 2021-08-27 NOTE — Telephone Encounter (Signed)
I called Dr. Benjamine Mola office they no longer practice in [place] they stopped that about two years ago. He only practices in [place]

## 2021-08-27 NOTE — Progress Notes (Signed)
[name]    Provider:  [name] Requesting Provider: [name], DO Primary Care Provider:  [name], DO  CC:  Neck pain, arm pain, myasthenia gravis, vertigo  Interval story 08/27/2021: The arm pain has resolved/extremely better after nerve block. He has been problems with dizziness. He went to bed during the morning and that bed was going around in circles. He sat on the bed and had to hold on to go to the bathroom, If he turns quick he gets, His son is law is a PT and showed him the epley maneuvers. He tried to work one day for dizziness. We stopped his metformen and he is on metformin and jardience. I spoke to them about ozemic,mounjaro he had a sinus infection. The meclizine helped. No other focal neurologic deficits, associated symptoms, inciting events or modifiable factors.  Patient complains of symptoms per HPI as well as the following symptoms: dizzy/vertigo . Pertinent negatives and positives per HPI. All others negative   HPI:  Kenneth Fields is a 60 y.o. male here as requested by [name], DO for neck pain and arm pain and weakness. He has chronic neck pain, ongoing for > 1 year, getting worse, much worse, chronic neck pain, worsening, crick in his neck, always on the right, started worsening over 12 weeks ago. Coming and going, now worsening, he can;t turn his head to the right without pain. He has decreased ROM, waking him up at night. Already been to orthopaedics, already had PT, taken steroid pack, taken analgesicam taken Gabapnetin. Has right arm weakness. Right arm severe radiating pain.   Reviewed notes, labs and imaging from outside physicians, which showed: arm pain  Review of Systems: Patient complains of symptoms per HPI as well as the following symptoms: severe arm pain. Pertinent negatives and positives per HPI. All others negative.   Social History   Socioeconomic History   Marital status: Married    Spouse  name: Not on file   Number of children: Not on file   Years of education: Not on file   Highest education level: 11th grade  Occupational History   Not on file  Tobacco Use   Smoking status: Every Day    Packs/day: 1.00    Years: 40.00    Pack years: 40.00    Types: Cigarettes   Smokeless tobacco: Never  Vaping Use   Vaping Use: Never used  Substance and Sexual Activity   Alcohol use: Yes    Alcohol/week: 30.0 standard drinks    Types: 18 Cans of beer, 12 Shots of liquor per week    Comment: 3 beers per night   Drug use: Yes   Sexual activity: Not on file  Other Topics Concern   Not on file  Social History Narrative   Live at home with his wife   Right handed   Caffeine: 1 diet mtn dew daily, occasional tea (decaf), 1 cup of coffee in the mornings (no coffee when it's warm outside)   Social Determinants of Health   Financial Resource Strain: Not on file  Food Insecurity: Not on file  Transportation Needs: Not on file  Physical Activity: Not on file  Stress: Not on file  Social Connections: Not on file  Intimate Partner Violence: Not on file    Family History  Problem Relation Age of Onset   Lung cancer Mother    Cancer Mother    [name]' disease Mother    Myasthenia gravis Neg  Hx     Past Medical History:  Diagnosis Date   COVID-19 08/2020   Diabetes ([place])    High cholesterol    Hypertension    Myasthenia gravis ([place])    Myasthenia gravis ([place])     Patient Active Problem List   Diagnosis Date Noted   Dizziness 07/22/2021   Moderate obstructive sleep apnea 01/15/2021   Cervical radiculopathy 11/15/2020   Excessive daytime sleepiness 08/21/2020   Snoring 08/21/2020   Long-term use of high-risk medication 08/21/2020   Cyst, dermoid, arm, right 04/13/2020   Myasthenia gravis ([place]) 04/25/2019   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 ([place]) 04/25/2019   Myasthenia gravis with (acute) exacerbation ([place]) 02/22/2019   Ocular myasthenia  gravis ([place]) 12/27/2018   Cigarette nicotine dependence without complication 94/17/4081   Hyperlipidemia 07/01/2016   Essential hypertension 02/14/2013    Past Surgical History:  Procedure Laterality Date   ELBOW SURGERY Right    for tendonitis   TYMPANOSTOMY TUBE PLACEMENT Bilateral    as a child    WISDOM TOOTH EXTRACTION      Current Outpatient Medications  Medication Sig Dispense Refill   benazepril (LOTENSIN) 40 MG tablet TAKE 1 TABLET BY MOUTH EVERY DAY 90 tablet 0   benzonatate (TESSALON PERLES) 100 MG capsule Take 1 capsule (100 mg total) by mouth 3 (three) times daily as needed for cough. 20 capsule 0   cetirizine (ZYRTEC) 10 MG tablet Take 10 mg by mouth daily as needed for allergies.     diltiazem (CARDIZEM CD) 120 MG 24 hr capsule TAKE 1 CAPSULE BY MOUTH EVERY DAY 90 capsule 0   hydrochlorothiazide (HYDRODIURIL) 25 MG tablet Take 1 tablet (25 mg total) by mouth daily. 90 tablet 3   mycophenolate (CELLCEPT) 500 MG tablet TAKE 2 TABLETS BY MOUTH TWICE DAILY 120 tablet 1   ondansetron (ZOFRAN) 8 MG tablet Take 1 tablet (8 mg total) by mouth every 8 (eight) hours as needed for nausea or vomiting. Or for vertigo 90 tablet 3   pantoprazole (PROTONIX) 40 MG tablet TAKE 1 TABLET BY MOUTH EVERY DAY 90 tablet 0   rosuvastatin (CRESTOR) 40 MG tablet TAKE 1 TABLET BY MOUTH EVERY DAY 90 tablet 0   VASCEPA 1 g capsule Take 2 capsules (2 g total) by mouth 2 (two) times daily. 120 capsule 12   amoxicillin-clavulanate (AUGMENTIN) 875-125 MG tablet Take 1 tablet by mouth 2 (two) times daily. 20 tablet 0   meclizine (ANTIVERT) 25 MG tablet Take 1 tablet (25 mg total) by mouth 3 (three) times daily as needed for dizziness. 90 tablet 6   No current facility-administered medications for this visit.    Allergies as of 08/27/2021 - Review Complete 08/27/2021  Allergen Reaction Noted   Bee venom Anaphylaxis 11/15/2020   Chantix [varenicline tartrate]  04/25/2019    Vitals: Ht 5\' 9"  (1.753  m)   Wt 206 lb (93.4 kg)   BMI 30.42 kg/m  Last Weight:  Wt Readings from Last 1 Encounters:  08/27/21 206 lb (93.4 kg)   Last Height:   Ht Readings from Last 1 Encounters:  08/27/21 5\' 9"  (1.753 m)     Physical exam: stable Exam: Gen: NAD, conversant, well nourised, obese, well groomed                     CV: RRR, no MRG. No Carotid Bruits. No peripheral edema, warm, nontender Eyes: Conjunctivae clear without exudates or hemorrhage  Neuro: stable Detailed Neurologic  Exam  Speech:    Speech is normal; fluent and spontaneous with normal comprehension.  Cognition:    The patient is oriented to person, place, and time;     recent and remote memory intact;     language fluent;     normal attention, concentration,     fund of knowledge Cranial Nerves:    The pupils are equal, round, and reactive to light.  Visual fields are full to finger confrontation. Extraocular movements are intact. Trigeminal sensation is intact and the muscles of mastication are normal. Slight right ptosis. The palate elevates in the midline. Hearing intact. Voice is normal. Shoulder shrug is normal. The tongue has normal motion without fasciculations.    Gait:    imbalance  Motor Observation:    No asymmetry, no atrophy, and no involuntary movements noted. Tone:    Normal muscle tone.    Posture:    Posture is normal. normal erect    Strength:  right biceps 4/5     Sensation: intact to LT     Reflex Exam:  DTR's: diminished right biceps reflex.    Toes:    The toes are downgoing bilaterally.   Clonus:    Clonus is absent.    Assessment/Plan: A 60 year old with myasthenia gravis, cervical radiculopathy here for vertigo/dizziness.  Myasthenia gravis:  Stable continue current medications  Neck pain:  Improved with injections, monitor  Vertigo/dizziness:  Ondansetron and meclizine for dizziness/BPP[facility] Call [facility] for aponitment - Vestibular therapy Start using flonase or OTC  steroid nasal spay !!!! daily Consider ENT because all this congestion may be causing fluid behind behind the ears and inflammation of the inner ear (Labyrinthitis or  neuronitis)- referal sent Zyrtec-D (pharmacy) 2x a dayfor 7-10 days Netti pot - use distilled water  They declined an MRI brain  Orders Placed This Encounter  Procedures   Ambulatory referral to Physical Therapy   Ambulatory referral to ENT    Meds ordered this encounter  Medications   meclizine (ANTIVERT) 25 MG tablet    Sig: Take 1 tablet (25 mg total) by mouth 3 (three) times daily as needed for dizziness.    Dispense:  90 tablet    Refill:  6   ondansetron (ZOFRAN) 8 MG tablet    Sig: Take 1 tablet (8 mg total) by mouth every 8 (eight) hours as needed for nausea or vomiting. Or for vertigo    Dispense:  90 tablet    Refill:  3     Cc: [name], DO,  [name], DO  Sarina Ill, MD  [facility] [address] [place], [place] [address]  Phone [phone] Fax [phone]  I spent over 40  minutes of face-to-face and non-face-to-face time with patient on the  1. Benign paroxysmal positional vertigo, unspecified laterality   2. Dizziness   3. Vertigo   4. Congestion of both ears   5. Subacute maxillary sinusitis     diagnosis.  This included previsit chart review, lab review, study review, order entry, electronic health record documentation, patient education on the different diagnostic and therapeutic options, counseling and coordination of care, risks and benefits of management, compliance, or risk factor reduction

## 2021-08-28 ENCOUNTER — Encounter: Payer: Self-pay | Admitting: Family Medicine

## 2021-08-28 ENCOUNTER — Other Ambulatory Visit: Payer: Self-pay | Admitting: Family Medicine

## 2021-08-28 ENCOUNTER — Encounter: Payer: Self-pay | Admitting: Neurology

## 2021-08-28 DIAGNOSIS — E11649 Type 2 diabetes mellitus with hypoglycemia without coma: Secondary | ICD-10-CM

## 2021-08-28 MED ORDER — METFORMIN HCL 500 MG PO TABS: 500.0000 mg | ORAL_TABLET | Freq: Two times a day (BID) | ORAL | 1 refills | Status: DC

## 2021-08-28 NOTE — Telephone Encounter (Signed)
Noted, thank you

## 2021-08-28 NOTE — Telephone Encounter (Signed)
Received this message from patient:   [place] (supporting Melvenia Beam, MD) 5 minutes ago (8:31 AM)   There are no ENTs in [facility].  Please make a referral to a Dr. in [facility].   Thank you, Kenneth Fields

## 2021-09-02 NOTE — Telephone Encounter (Signed)
Noted, [facility] ph # [phone].

## 2021-09-04 ENCOUNTER — Encounter: Payer: Self-pay | Admitting: Family Medicine

## 2021-09-04 ENCOUNTER — Ambulatory Visit: Payer: BC Managed Care – PPO | Admitting: Family Medicine

## 2021-09-04 VITALS — BP 159/98 | HR 64 | Temp 97.9°F | Ht 69.0 in | Wt 211.0 lb

## 2021-09-04 DIAGNOSIS — E11649 Type 2 diabetes mellitus with hypoglycemia without coma: Secondary | ICD-10-CM

## 2021-09-04 DIAGNOSIS — I152 Hypertension secondary to endocrine disorders: Secondary | ICD-10-CM | POA: Diagnosis not present

## 2021-09-04 DIAGNOSIS — E1159 Type 2 diabetes mellitus with other circulatory complications: Secondary | ICD-10-CM | POA: Diagnosis not present

## 2021-09-04 NOTE — Progress Notes (Signed)
Subjective: CC: Follow-up hyperglycemia PCP: Kenneth Norlander, DO JKK:XFGH Kenneth Fields is a 60 y.o. male presenting to clinic today for:  1.  Hyperglycemia I saw patient back in November for uncontrolled diabetes.  That time A1c was over 8.  We started Jardiance.  He was continued on metformin.  He notes that the Jardiance was intolerable and cause excessive dryness.  He apparently had not been on metformin during that time but did resume it after being seen in the ER and given the metformin Rx.  I renewed this recently for him but he notes that the new batch of medicine he received from the pharmacy seems to be inducing a constipation.  He subsequently has discontinued this after the last 3 days and constipation is improving.  Blood sugar this morning was in the 140s and he has been consistently in the 140s for the last few days.  Prior to that time he was seeing fluctuations as high as 200s.  He does not check 2-hour postprandial blood sugars.  He has been monitoring blood pressures and they have been running about 150 over 70s to 80s.  He is compliant with all blood pressure medications.  No chest pain, shortness of breath reported.   ROS: Per HPI  Allergies  Allergen Reactions   Bee Venom Anaphylaxis   Chantix [Varenicline Tartrate]     "mental problems"   Past Medical History:  Diagnosis Date   COVID-19 08/2020   Diabetes ([place])    High cholesterol    Hypertension    Myasthenia gravis ([place])    Myasthenia gravis ([place])     Current Outpatient Medications:    benazepril (LOTENSIN) 40 MG tablet, TAKE 1 TABLET BY MOUTH EVERY DAY, Disp: 90 tablet, Rfl: 0   benzonatate (TESSALON PERLES) 100 MG capsule, Take 1 capsule (100 mg total) by mouth 3 (three) times daily as needed for cough., Disp: 20 capsule, Rfl: 0   cetirizine (ZYRTEC) 10 MG tablet, Take 10 mg by mouth daily as needed for allergies., Disp: , Rfl:    diltiazem (CARDIZEM CD) 120 MG 24 hr capsule, TAKE 1 CAPSULE BY MOUTH EVERY  DAY, Disp: 90 capsule, Rfl: 0   hydrochlorothiazide (HYDRODIURIL) 25 MG tablet, Take 1 tablet (25 mg total) by mouth daily., Disp: 90 tablet, Rfl: 3   meclizine (ANTIVERT) 25 MG tablet, Take 1 tablet (25 mg total) by mouth 3 (three) times daily as needed for dizziness., Disp: 90 tablet, Rfl: 6   metFORMIN (GLUCOPHAGE) 500 MG tablet, Take 1 tablet (500 mg total) by mouth 2 (two) times daily., Disp: 180 tablet, Rfl: 1   mycophenolate (CELLCEPT) 500 MG tablet, TAKE 2 TABLETS BY MOUTH TWICE DAILY, Disp: 120 tablet, Rfl: 1   ondansetron (ZOFRAN) 8 MG tablet, Take 1 tablet (8 mg total) by mouth every 8 (eight) hours as needed for nausea or vomiting. Or for vertigo, Disp: 90 tablet, Rfl: 3   pantoprazole (PROTONIX) 40 MG tablet, TAKE 1 TABLET BY MOUTH EVERY DAY, Disp: 90 tablet, Rfl: 0   rosuvastatin (CRESTOR) 40 MG tablet, TAKE 1 TABLET BY MOUTH EVERY DAY, Disp: 90 tablet, Rfl: 0   VASCEPA 1 g capsule, Take 2 capsules (2 g total) by mouth 2 (two) times daily., Disp: 120 capsule, Rfl: 12 Social History   Socioeconomic History   Marital status: Married    Spouse name: Not on file   Number of children: Not on file   Years of education: Not on file   Highest education  level: 11th grade  Occupational History   Not on file  Tobacco Use   Smoking status: Every Day    Packs/day: 1.00    Years: 40.00    Pack years: 40.00    Types: Cigarettes   Smokeless tobacco: Never  Vaping Use   Vaping Use: Never used  Substance and Sexual Activity   Alcohol use: Yes    Alcohol/week: 30.0 standard drinks    Types: 18 Cans of beer, 12 Shots of liquor per week    Comment: 3 beers per night   Drug use: Yes   Sexual activity: Not on file  Other Topics Concern   Not on file  Social History Narrative   Live at home with his wife   Right handed   Caffeine: 1 diet mtn dew daily, occasional tea (decaf), 1 cup of coffee in the mornings (no coffee when it's warm outside)   Social Determinants of Health    Financial Resource Strain: Not on file  Food Insecurity: Not on file  Transportation Needs: Not on file  Physical Activity: Not on file  Stress: Not on file  Social Connections: Not on file  Intimate Partner Violence: Not on file   Family History  Problem Relation Age of Onset   Lung cancer Mother    Cancer Mother    [name]' disease Mother    Myasthenia gravis Neg Hx     Objective: Office vital signs reviewed. BP (!) 159/98    Pulse 64    Temp 97.9 F (36.6 C)    Ht 5\' 9"  (1.753 m)    Wt 211 lb (95.7 kg)    SpO2 97%    BMI 31.16 kg/m   Physical Examination:  General: Awake, alert, well nourished, No acute distress HEENT: sclera white Cardio: regular rate and rhythm, S1S2 heard, no murmurs appreciated Pulm: clear to auscultation bilaterally, no wheezes, rhonchi or rales; normal work of breathing on room air  Assessment/ Plan: 60 y.o. male   Uncontrolled type 2 diabetes mellitus with hypoglycemia without coma ([name])  Hypertension associated with diabetes ([name])  I have highly recommended that if he is going to stay off of this metformin that he monitor his morning blood sugars as well as 2-hour postprandial blood sugars at least at suppertime.  We will keep a log of this for the next 2 weeks and then contact me back with that.  I have also encouraged him to reach out to his pharmacy to see if perhaps he might obtain a different manufacturer for him.  The pills department today did unfortunately smell of fish and I could see why he would be deterred from taking this.  Blood pressure was not at goal today.  I think that he may benefit from referral to hypertension clinic but I would like him to monitor blood pressures consistently for the next 2 weeks before we make that move.  He is unfortunately on max dose of all of his medications currently.  No orders of the defined types were placed in this encounter.  No orders of the defined types were placed in this  encounter.    Kenneth Norlander, DO [place] [phone]

## 2021-09-06 ENCOUNTER — Other Ambulatory Visit: Payer: Self-pay

## 2021-09-06 DIAGNOSIS — E11649 Type 2 diabetes mellitus with hypoglycemia without coma: Secondary | ICD-10-CM

## 2021-09-06 MED ORDER — METFORMIN HCL 500 MG PO TABS: 500.0000 mg | ORAL_TABLET | Freq: Two times a day (BID) | ORAL | 1 refills | Status: DC

## 2021-09-24 ENCOUNTER — Other Ambulatory Visit: Payer: Self-pay

## 2021-09-24 ENCOUNTER — Ambulatory Visit (HOSPITAL_COMMUNITY): Payer: BC Managed Care – PPO | Attending: Neurology | Admitting: Physical Therapy

## 2021-09-24 ENCOUNTER — Encounter (HOSPITAL_COMMUNITY): Payer: Self-pay | Admitting: Physical Therapy

## 2021-09-24 DIAGNOSIS — R42 Dizziness and giddiness: Secondary | ICD-10-CM | POA: Diagnosis not present

## 2021-09-24 DIAGNOSIS — H8110 Benign paroxysmal vertigo, unspecified ear: Secondary | ICD-10-CM | POA: Diagnosis not present
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
## 2021-10-01 ENCOUNTER — Ambulatory Visit (HOSPITAL_COMMUNITY): Payer: BC Managed Care – PPO | Admitting: Physical Therapy

## 2021-10-01 ENCOUNTER — Other Ambulatory Visit: Payer: Self-pay

## 2021-10-01 DIAGNOSIS — R42 Dizziness and giddiness: Secondary | ICD-10-CM

## 2021-10-01 DIAGNOSIS — H8110 Benign paroxysmal vertigo, unspecified ear: Secondary | ICD-10-CM | POA: Diagnosis not present

## 2021-10-01 NOTE — Therapy (Signed)
[place], [place], [address] Phone: [phone]   Fax:  [phone]  Physical Therapy Treatment  Patient Details  Name: Kenneth Fields MRN: [id number] Date of Birth: [birth year] Referring Provider (PT): Sarina Ill MD  PHYSICAL THERAPY DISCHARGE SUMMARY  Visits from Start of Care: 2  Current functional level related to goals / functional outcomes: Pt states 80% better   Remaining deficits: Slight dizziness first thing in the morning    Education / Equipment: None    Patient agrees to discharge. Patient goals were met. Patient is being discharged due to meeting the stated rehab goals.  Encounter Date: 10/01/2021   PT End of Session - 10/01/21 1553     Visit Number 2    Number of Visits 2    Date for PT Re-Evaluation 10/08/21    Authorization Type [employer] COMM PPO    PT Start Time 5956    PT Stop Time 1550    PT Time Calculation (min) 15 min    Activity Tolerance Patient tolerated treatment well    Behavior During Therapy WFL for tasks assessed/performed             Past Medical History:  Diagnosis Date   COVID-19 08/2020   Diabetes ([place])    High cholesterol    Hypertension    Myasthenia gravis ([place])    Myasthenia gravis ([place])     Past Surgical History:  Procedure Laterality Date   ELBOW SURGERY Right    for tendonitis   TYMPANOSTOMY TUBE PLACEMENT Bilateral    as a child    WISDOM TOOTH EXTRACTION      There were no vitals filed for this visit.   Subjective Assessment - 10/01/21 1551     Subjective Pt states that he is doing a whole lot better.  He is still a little dizzy when he wakes up in the morning but it to goes away after a little bit.  He hasn't took a pill for dizziness in a week.  He is at least 80 % improved.                     Vestibular Assessment - 10/01/21 0001       Positional Testing   Dix-Hallpike Dix-Hallpike Right;Dix-Hallpike Left       Dix-Hallpike Right   Dix-Hallpike Right Symptoms No nystagmus      Dix-Hallpike Left   Dix-Hallpike Left Symptoms No nystagmus      Positional Sensitivities   Up from Right Hallpike No dizziness    Up from Left Hallpike No dizziness    Nose to Right Knee No dizziness    Nose to Left Knee No dizziness    Head Turning x 5 No dizziness    Head Nodding x 5 No dizziness                                 PT Short Term Goals - 10/01/21 1548       PT SHORT TERM GOAL #1   Title Patient will be independent with self-management strategies to improve functional outcomes    Time 2    Period Weeks    Status Achieved    Target Date 10/08/21      PT SHORT TERM GOAL #2   Title patient will report full resolution of dizziness with getting in and out of bed for  improved quality of life    Time 2    Period Weeks    Status Achieved    Target Date 10/08/21                      Plan - 10/01/21 1554     Clinical Impression Statement PT 80% improved.  Pt has full cervical ROM, unable to provoke sx of dizziness.  Pt agrees to discharge at this time.    Examination-Activity Limitations Sleep;Transfers    Examination-Participation Restrictions Other    Stability/Clinical Decision Making Stable/Uncomplicated    Rehab Potential Good    PT Frequency 1x / week    PT Duration 3 weeks    PT Treatment/Interventions Canalith Repostioning;Vestibular;Visual/perceptual remediation/compensation;Manual techniques;Therapeutic exercise;Passive range of motion;Patient/family education    PT Next Visit Plan Discharge.    Consulted and Agree with Plan of Care Patient             Patient will benefit from skilled therapeutic intervention in order to improve the following deficits and impairments:  Dizziness  Visit Diagnosis: Dizziness and giddiness     Problem List Patient Active Problem List   Diagnosis Date Noted   Dizziness 07/22/2021   Moderate obstructive  sleep apnea 01/15/2021   Cervical radiculopathy 11/15/2020   Excessive daytime sleepiness 08/21/2020   Snoring 08/21/2020   Long-term use of high-risk medication 08/21/2020   Cyst, dermoid, arm, right 04/13/2020   Myasthenia gravis ([place]) 04/25/2019   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 ([place]) 04/25/2019   Myasthenia gravis with (acute) exacerbation ([place]) 02/22/2019   Ocular myasthenia gravis ([place]) 12/27/2018   Cigarette nicotine dependence without complication 79/72/8206   Hyperlipidemia 07/01/2016   Essential hypertension 02/14/2013   Rayetta Humphrey, PT CLT [phone]  10/01/2021, 3:55 PM  [place], [place], [address] Phone: [phone]   Fax:  [phone]  Name: STARK AGUINAGA MRN: [id number] Date of Birth: 28-May-1961

## 2021-10-14 ENCOUNTER — Other Ambulatory Visit: Payer: Self-pay | Admitting: Neurology

## 2021-10-14 DIAGNOSIS — G7000 Myasthenia gravis without (acute) exacerbation: Secondary | ICD-10-CM

## 2021-11-06 ENCOUNTER — Ambulatory Visit: Payer: BC Managed Care – PPO | Admitting: Family Medicine

## 2021-11-07 ENCOUNTER — Encounter: Payer: Self-pay | Admitting: Family Medicine

## 2021-11-08 ENCOUNTER — Encounter: Payer: Self-pay | Admitting: Family

## 2021-11-08 ENCOUNTER — Ambulatory Visit: Payer: BC Managed Care – PPO | Admitting: Family

## 2021-11-08 VITALS — BP 130/75 | HR 70 | Temp 98.6°F | Ht 69.0 in | Wt 200.4 lb

## 2021-11-08 DIAGNOSIS — G7000 Myasthenia gravis without (acute) exacerbation: Secondary | ICD-10-CM

## 2021-11-08 DIAGNOSIS — E11649 Type 2 diabetes mellitus with hypoglycemia without coma: Secondary | ICD-10-CM | POA: Diagnosis not present

## 2021-11-08 DIAGNOSIS — R5383 Other fatigue: Secondary | ICD-10-CM

## 2021-11-08 DIAGNOSIS — I10 Essential (primary) hypertension: Secondary | ICD-10-CM

## 2021-11-08 DIAGNOSIS — L750 Bromhidrosis: Secondary | ICD-10-CM

## 2021-11-08 LAB — GLUCOSE HEMOCUE WAIVED: Glu Hemocue Waived: 150 mg/dL — ABNORMAL HIGH (ref 70–99)

## 2021-11-08 LAB — BAYER DCA HB A1C WAIVED: HB A1C (BAYER DCA - WAIVED): 9.6 % — ABNORMAL HIGH (ref 4.8–5.6)

## 2021-11-08 MED ORDER — METFORMIN HCL ER 750 MG PO TB24: 1500.0000 mg | ORAL_TABLET | Freq: Every day | ORAL | 1 refills | Status: DC

## 2021-11-08 NOTE — Patient Instructions (Signed)

## 2021-11-08 NOTE — Progress Notes (Signed)
Subjective:    Patient ID: Kenneth Fields, male    DOB: 18-Mar-1961, 61 y.o.   MRN: [id number]  Chief Complaint  Patient presents with   Diabetes    Body odor smells sour started 3 days ago    Fatigue   Pt presents to the office today with elevated blood glucose. He is a diabetic and currently taking metformin 500 mg BID.   He has myasthenia gravis and was started on prednisone. He states when he was on this his glucose was 600's. Since stopping this his glucose is 250's.   He reports over the last three days he has had sour body odor. This is very unusual.    He is complaining of fatigue.  Diabetes He presents for his follow-up diabetic visit. He has type 2 diabetes mellitus. Associated symptoms include blurred vision. Pertinent negatives for diabetes include no foot paresthesias. Symptoms are stable. Diabetic complications include heart disease. Risk factors for coronary artery disease include dyslipidemia, diabetes mellitus, male sex and sedentary lifestyle. He is following a generally healthy diet. His overall blood glucose range is >200 mg/dl.  Hypertension This is a chronic problem. The current episode started more than 1 year ago. The problem has been waxing and waning since onset. The problem is uncontrolled. Associated symptoms include blurred vision and malaise/fatigue. Pertinent negatives include no peripheral edema or shortness of breath. Risk factors for coronary artery disease include dyslipidemia, diabetes mellitus, male gender and obesity. The current treatment provides mild improvement.  Nicotine Dependence Presents for follow-up visit. His urge triggers include company of smokers. The symptoms have been stable. He smokes 1 pack of cigarettes per day.     Review of Systems  Constitutional:  Positive for malaise/fatigue.  Eyes:  Positive for blurred vision.  Respiratory:  Negative for shortness of breath.   All other systems reviewed and are negative.     Objective:    Physical Exam Vitals reviewed.  Constitutional:      General: He is not in acute distress.    Appearance: He is well-developed. He is obese.  HENT:     Head: Normocephalic.  Eyes:     General:        Right eye: No discharge.        Left eye: No discharge.     Pupils: Pupils are equal, round, and reactive to light.  Neck:     Thyroid: No thyromegaly.  Cardiovascular:     Rate and Rhythm: Normal rate and regular rhythm.     Heart sounds: Normal heart sounds. No murmur heard. Pulmonary:     Effort: Pulmonary effort is normal. No respiratory distress.     Breath sounds: Normal breath sounds. No wheezing.  Abdominal:     General: Bowel sounds are normal. There is no distension.     Palpations: Abdomen is soft.     Tenderness: There is no abdominal tenderness.  Musculoskeletal:        General: No tenderness. Normal range of motion.     Cervical back: Normal range of motion and neck supple.  Skin:    General: Skin is warm and dry.     Findings: No erythema or rash.  Neurological:     Mental Status: He is alert and oriented to person, place, and time.     Cranial Nerves: No cranial nerve deficit.     Deep Tendon Reflexes: Reflexes are normal and symmetric.  Psychiatric:        Behavior: Behavior  normal.        Thought Content: Thought content normal.        Judgment: Judgment normal.      BP 130/75    Pulse 70    Temp 98.6 F (37 C) (Temporal)    Ht 5\' 9"  (1.753 m)    Wt 200 lb 6.4 oz (90.9 kg)    BMI 29.59 kg/m      Assessment & Plan:  Kenneth Fields comes in today with chief complaint of Diabetes (Body odor smells sour started 3 days ago ) and Fatigue   Diagnosis and orders addressed:  1. Uncontrolled type 2 diabetes mellitus with hypoglycemia without coma (HCC) Will increase metformin XR to 1500 mg daily Encourage low carb diet Encourage a visit with clinical pharm for education- declines at this time Follow up with PCP in 1-2 months - Glucose Hemocue Waived - Bayer  DCA Hb A1c Waived - metFORMIN (GLUCOPHAGE-XR) 750 MG 24 hr tablet; Take 2 tablets (1,500 mg total) by mouth daily with breakfast.  Dispense: 180 tablet; Refill: 1  2. Essential hypertension Improved after sitting   3. Myasthenia gravis ([place])  4. Other fatigue  5. Body odor None on exam, could be related to high sugars? Denies any changes in diet.    Labs pending Health Maintenance reviewed Diet and exercise encouraged  Follow up plan: 1-2 months with PCP   Evelina Dun, FNP

## 2021-11-11 ENCOUNTER — Other Ambulatory Visit: Payer: Self-pay | Admitting: Family Medicine

## 2021-11-11 DIAGNOSIS — K219 Gastro-esophageal reflux disease without esophagitis: Secondary | ICD-10-CM

## 2021-11-25 ENCOUNTER — Telehealth: Payer: BC Managed Care – PPO | Admitting: Neurology

## 2021-11-25 DIAGNOSIS — R42 Dizziness and giddiness: Secondary | ICD-10-CM | POA: Diagnosis not present

## 2021-11-25 DIAGNOSIS — F17210 Nicotine dependence, cigarettes, uncomplicated: Secondary | ICD-10-CM | POA: Diagnosis not present

## 2021-11-25 DIAGNOSIS — H93292 Other abnormal auditory perceptions, left ear: Secondary | ICD-10-CM | POA: Diagnosis not present

## 2021-11-25 DIAGNOSIS — H6121 Impacted cerumen, right ear: Secondary | ICD-10-CM | POA: Diagnosis not present

## 2021-12-23 ENCOUNTER — Telehealth (INDEPENDENT_AMBULATORY_CARE_PROVIDER_SITE_OTHER): Payer: BC Managed Care – PPO | Admitting: Neurology

## 2021-12-23 DIAGNOSIS — G7000 Myasthenia gravis without (acute) exacerbation: Secondary | ICD-10-CM | POA: Diagnosis not present

## 2021-12-23 DIAGNOSIS — M5412 Radiculopathy, cervical region: Secondary | ICD-10-CM | POA: Diagnosis not present

## 2021-12-23 DIAGNOSIS — R42 Dizziness and giddiness: Secondary | ICD-10-CM | POA: Diagnosis not present

## 2021-12-23 MED ORDER — MYCOPHENOLATE MOFETIL 500 MG PO TABS: 1000.0000 mg | ORAL_TABLET | Freq: Two times a day (BID) | ORAL | 4 refills | Status: DC

## 2021-12-23 NOTE — Progress Notes (Signed)
?[name] ? ? ? ?Provider:  [name] ?Requesting Provider: [name], DO ?Primary Care Provider:  [name], DO ? ?CC:  Ocular Myasthenia Gravis, neck pain (cervical radic), dizziness ? ?Virtual Visit via Video Note ? ?I connected with Kenneth Fields on 12/23/21 at  2:00 PM EDT by a video enabled telemedicine application and verified that I am speaking with the correct person using two identifiers. ? ?Location: ?Patient: home  ?Provider: office ?  ?I discussed the limitations of evaluation and management by telemedicine and the availability of in person appointments. The patient expressed understanding and agreed to proceed. ? ? ? ?Follow Up Instructions: ? ?  ?I discussed the assessment and treatment plan with the patient. The patient was provided an opportunity to ask questions and all were answered. The patient agreed with the plan and demonstrated an understanding of the instructions. ?  ?The patient was advised to call back or seek an in-person evaluation if the symptoms worsen or if the condition fails to improve as anticipated. ? ?I provided 25 minutes of non-face-to-face time during this encounter. ? ? ?Melvenia Beam, MD  ? ?12/23/2021: Since injections no problems. He went to ENT for the vertigo, he does the exercises and it goes away. He has not been dizzy anymore. ENT suggested a hearing test. He hasn't ben dizzy in a while. No symptoms, medication is working well, no problems with double or droopy eyelids, his right eye sometimes when he is tired it may droop but nothing significant and better with a good night's sleep. He is working every day. He feels weaker, he is not as strong. Can get up of the floor, can pick up things but nothing as heavy. No problems swallowing or breathing. He eats fast if he has a big mouthful he may need to drink no choking or coughing. He has started working out, he hadn't worked out in a long time and that may be it. No shortness of  breath, working fine every day, plays golf. No side effects to the medications. Managing diabetes, has lost 5 pounds. No other complaints.  ? ?Patient complains of symptoms per HPI as well as the following symptoms: none . Pertinent negatives and positives per HPI. All others negative ? ? ?Interval story 08/27/2021: Kenneth Fields is a 61 y.o. male here as requested by [name], DO for neck pain and arm pain and weakness. Stable myasthenia gravis.  He has chronic neck pain, ongoing for > 1 year, getting worse, much worse, chronic neck pain, worsening, crick in his neck, always on the right, started worsening over 12 weeks ago. Coming and going, now worsening, he can;t turn his head to the right without pain. He has decreased ROM, waking him up at night. Already been to orthopaedics, already had PT, taken steroid pack, taken analgesicam taken Gabapnetin. Has right arm weakness. Right arm severe radiating pain.The arm pain has resolved/extremely better after nerve block. He has been problems with dizziness. He went to bed during the morning and that bed was going around in circles. He sat on the bed and had to hold on to go to the bathroom, If he turns quick he gets, His son is law is a PT and showed him the epley maneuvers. He tried to work one day for dizziness. We stopped his metformen and he is on metformin and jardience. I spoke to them about ozemic,mounjaro he had a sinus infection. The meclizine helped. No other  focal neurologic deficits, associated symptoms, inciting events or modifiable factors. ? ?Patient complains of symptoms per HPI as well as the following symptoms: dizzy/vertigo . Pertinent negatives and positives per HPI. All others negative ? ? ?11/23/2019: He is here with his wife for follow up He is doing extremely well. He is having insurance problems. He is getting IVIG every 6 weeks at this time he has been on cellcept for an adequate amount of time. He hs done very well gong 7 weeks without  IVIG at this time we will stop IVIG and continue cellcept. He recently had labs and doing well. Off of the metformin. I highly recommend that he gets the vaccine. CBC/CMP look fine, this is great news.  ? ? ? ?07/07/2019: Patient is here with his wife.  Since he has been on the steroid his glucose has significantly increased, he is having blurry vision, he is urinating a lot.  Luckily he finally was able to see a primary care who started him on metformin.  We decreased his steroids from 45 mg a day to 20 mg a day and we were able to get him IVIG so that we can decrease his steroids now even more.  We will keep him on CellCept and IVIG at this time.  We will decrease his steroids over 9 days I did discuss with him that this can be dangerous if you do not titrate slowly, especially because his body may not be making the same amount of steroids and he needs adrenaline for blood pressure support and others, if he has any symptoms whatsoever he is to go back to his prior dose of prednisone and call me ASAP or call 911.  Unfortunately it takes medications like CellCept or some of the other immunosuppressants for myasthenia gravis 6 months to a year to work and so in the meantime we do use steroids which can cause problems like this, we did discuss this but we initially started.  We will decrease his steroids.  At this time we will keep him on IVIG for 4 weeks, then increase to 5 weeks and up to 6 weeks.  At that time when IVIG is every 6 weeks if he is doing well than likely the CellCept has started to work.  If he has symptoms again we will consider increasing the CellCept to 1500 mg twice a day and returning IVIG to every 3 or 4 weeks instead. ? ?'15mg'$  for 3 days, '10mg'$  for 3 days, '5mg'$  for 3 days and then stop. October 19-20 follow-up. ? ?IVIG in 4 weeks. If doing well extend to 5 weeks. If doing well then extend next treatment to 6 weeks. Then stop. IVIG scheduled for September 14-15. Then the next one will be in 5  weeks. Will make sure IVIG is the one we give people with diabetes.  ? ? ?Interval history 03/21/2019: I spoke to his pcp personally, Unfortunately his pcp retired and he has not had his glucose tested. He feels good. No side effects from steroids, he has insomnia, he started taking prilosec and his stomach problems are better. He is trying to find a new pcp. He wakes up at 3am wide awake. We will start tapering the steroids, we discussed theplan if his glucose is elevated we will have to taper steroids quickly and start IVIG. He is sweating a lot with the steroids. The mestinon is working.  ? ?Addendum 6/3: I spoke with Dr. Edrick Oh, he has follow p with patient, will monitor  glucose and treat as necessary and ensure patient has close monitoring due to high dose steroids and immunosuppresants.  ? ?02/21/2019: His symptoms are worsening. He has increased Cellcept to 2 pills twice daily.  He is going back to Dr. Edrick Oh for blood work, he discussed with pcp, will start steroids. Mestinon not working and affecting stomach, stop taking it. Will start steroids. He has significant ptosis, has to hold his eyelids up to see, he is having blurry vision worse when looking to the left. No swallowing difficulty, breathing difficult, changes in voice quality, limb weakness. Will start steroids, discussed his pre-diabetes will need to follow very carefully with pcp. Will try and get IVIG approved due to crisis, steroids take upwards of 4-6 weeks and patient's vision is significantly affected.  ? 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 ? ?Interval history 01/24/2019: His daughter was mistaken, he did not lose vision when sitting up.  He takes mestinon 1 pill every 3-4 hours. It doesn't last now, it isn;t as good. Now it only lasts a few hours but it does seem to help. He can increase to 1.5 pills and to a max of 2 pills every 4-6 hours but watch for side effects can cause cholinergic crisis, a state characterized by increasing muscle  weakness which, through involvement of the muscles of respiration, may lead to death, we discussed this at length. Also discussed steroids however his hgba1c is 6.1 and he has not seen his pcp about this,

## 2022-01-07 ENCOUNTER — Encounter: Payer: Self-pay | Admitting: Family Medicine

## 2022-01-07 ENCOUNTER — Ambulatory Visit: Payer: BC Managed Care – PPO | Admitting: Family Medicine

## 2022-01-07 VITALS — BP 139/85 | HR 66 | Temp 97.2°F | Ht 69.0 in | Wt 204.2 lb

## 2022-01-07 DIAGNOSIS — Z79899 Other long term (current) drug therapy: Secondary | ICD-10-CM | POA: Diagnosis not present

## 2022-01-07 DIAGNOSIS — G7000 Myasthenia gravis without (acute) exacerbation: Secondary | ICD-10-CM | POA: Diagnosis not present

## 2022-01-07 DIAGNOSIS — I152 Hypertension secondary to endocrine disorders: Secondary | ICD-10-CM

## 2022-01-07 DIAGNOSIS — E1159 Type 2 diabetes mellitus with other circulatory complications: Secondary | ICD-10-CM | POA: Diagnosis not present

## 2022-01-07 DIAGNOSIS — E11649 Type 2 diabetes mellitus with hypoglycemia without coma: Secondary | ICD-10-CM | POA: Diagnosis not present

## 2022-01-07 DIAGNOSIS — E1169 Type 2 diabetes mellitus with other specified complication: Secondary | ICD-10-CM

## 2022-01-07 DIAGNOSIS — E785 Hyperlipidemia, unspecified: Secondary | ICD-10-CM

## 2022-01-07 DIAGNOSIS — F5101 Primary insomnia: Secondary | ICD-10-CM

## 2022-01-07 LAB — BAYER DCA HB A1C WAIVED: HB A1C (BAYER DCA - WAIVED): 8.5 % — ABNORMAL HIGH (ref 4.8–5.6)

## 2022-01-07 MED ORDER — ZOLPIDEM TARTRATE 5 MG PO TABS: 2.5000 mg | ORAL_TABLET | Freq: Every evening | ORAL | 2 refills | Status: DC | PRN

## 2022-01-07 NOTE — Patient Instructions (Signed)
Goal sugar less than 150.  If you are seeing sugars above 150, let me know and I will add Glipizide. ?Goal A1c <7.0. ?Blood pressure looking better. ?Get Diabetic retinal exam done and have faxed to our office ?Your diabetic foot exam was NORMAL today. ?Adding Ambien at bedtime IF needed.  We discussed the dangers associated with this medication. ? ?Controlled Substance Guidelines:  ?1. You cannot get an early refill, even it is lost.  ?2. You cannot get controlled medications from any other doctor, unless it is the emergency department and related to a new problem or injury.  ?3. You cannot use alcohol, marijuana, cocaine or any other recreational drugs while using this medication. This is very dangerous.  ?4. You are willing to have your urine drug tested at each visit.  ?5. You will not drive while using this medication, because that can put yourself and others in serious danger of an accident. ?6. If any medication is stolen, then there must be a police report to verify it, or it cannot be refilled.  ?7. I will not prescribe these medications for longer than 3 months.  ?8. You must bring your pill bottle to each visit.  ?9. You must use the same pharmacy for all refills for the medication, unless you clear it with me beforehand.  ?10. You cannot share or sell this medication.  ? ?

## 2022-01-07 NOTE — Progress Notes (Signed)
? ?Subjective: ?CC:DM ?PCP: [name], DO ?CVE:Kenneth Fields is a 61 y.o. male presenting to clinic today for: ? ?1. Type 2 Diabetes with hypertension, hyperlipidemia:  ?Metformin extended release 750 mg, 2 tablets daily.  This was an increase at last visit 2 months ago for uncontrolled blood sugars with A1c of 9.6.  He is continued on diltiazem, hydrochlorothiazide, benazepril and Crestor. BGs are now running 130-160s both am and pm. Still having some thirst and nightly nocturia (1-2x/nt). No visual disturbance.  Had eye exam done in [place] in last year. Not sure about retinal exam though. ? ?Last eye exam: needs ?Last foot exam: needs ?Last A1c:  ?Lab Results  ?Component Value Date  ? HGBA1C 9.6 (H) 11/08/2021  ? ?Nephropathy screen indicated?: on ACE-I ?Last flu, zoster and/or pneumovax:  ?Immunization History  ?Administered Date(s) Administered  ? Janssen (J&J) SARS-COV-2 Vaccination 01/23/2020  ? Pneumococcal Polysaccharide-23 07/23/2014  ? Tdap 09/22/2009, 07/13/2010, 02/14/2013  ? ? ?ROS: no CP, SOB,edema or falls ? ?2. Insomnia ?Having difficulty staying asleep.  He does get up a couple of times a night to urinate.  He often cannot go back to sleep after he wakes up.  Typically does not have a ton of problems falling asleep.  Denies any excessive caffeine intake.  No snoring or apneic spells.  He has been on Ambien in the past when his mother died about 2 decades ago and this seemed to help with sleep.  He is asking to go on this intermittently if needed. ? ? ?ROS: Per HPI ? ?Allergies  ?Allergen Reactions  ? Bee Venom Anaphylaxis  ? Chantix [Varenicline Tartrate]   ?  "mental problems"  ? ?Past Medical History:  ?Diagnosis Date  ? COVID-19 08/2020  ? Diabetes ([place])   ? High cholesterol   ? Hypertension   ? Myasthenia gravis ([place])   ? Myasthenia gravis ([place])   ? ? ?Current Outpatient Medications:  ?  benazepril (LOTENSIN) 40 MG tablet, TAKE 1 TABLET BY MOUTH EVERY DAY, Disp: 90 tablet, Rfl: 0 ?   cetirizine (ZYRTEC) 10 MG tablet, Take 10 mg by mouth daily as needed for allergies., Disp: , Rfl:  ?  diltiazem (CARDIZEM CD) 120 MG 24 hr capsule, TAKE 1 CAPSULE BY MOUTH EVERY DAY, Disp: 90 capsule, Rfl: 0 ?  hydrochlorothiazide (HYDRODIURIL) 25 MG tablet, Take 1 tablet (25 mg total) by mouth daily., Disp: 90 tablet, Rfl: 3 ?  meclizine (ANTIVERT) 25 MG tablet, Take 1 tablet (25 mg total) by mouth 3 (three) times daily as needed for dizziness., Disp: 90 tablet, Rfl: 6 ?  metFORMIN (GLUCOPHAGE-XR) 750 MG 24 hr tablet, Take 2 tablets (1,500 mg total) by mouth daily with breakfast., Disp: 180 tablet, Rfl: 1 ?  mycophenolate (CELLCEPT) 500 MG tablet, Take 2 tablets (1,000 mg total) by mouth 2 (two) times daily., Disp: 360 tablet, Rfl: 4 ?  pantoprazole (PROTONIX) 40 MG tablet, TAKE 1 TABLET BY MOUTH EVERY DAY, Disp: 90 tablet, Rfl: 1 ?  rosuvastatin (CRESTOR) 40 MG tablet, TAKE 1 TABLET BY MOUTH EVERY DAY, Disp: 90 tablet, Rfl: 0 ?  VASCEPA 1 g capsule, Take 2 capsules (2 g total) by mouth 2 (two) times daily., Disp: 120 capsule, Rfl: 12 ?Social History  ? ?Socioeconomic History  ? Marital status: Married  ?  Spouse name: Not on file  ? Number of children: Not on file  ? Years of education: Not on file  ? Highest education level: 11th grade  ?Occupational  History  ? Not on file  ?Tobacco Use  ? Smoking status: Every Day  ?  Packs/day: 1.00  ?  Years: 40.00  ?  Pack years: 40.00  ?  Types: Cigarettes  ? Smokeless tobacco: Never  ?Vaping Use  ? Vaping Use: Never used  ?Substance and Sexual Activity  ? Alcohol use: Yes  ?  Alcohol/week: 30.0 standard drinks  ?  Types: 18 Cans of beer, 12 Shots of liquor per week  ?  Comment: 3 beers per night  ? Drug use: Yes  ? Sexual activity: Not on file  ?Other Topics Concern  ? Not on file  ?Social History Narrative  ? Live at home with his wife  ? Right handed  ? Caffeine: 1 diet mtn dew daily, occasional tea (decaf), 1 cup of coffee in the mornings (no coffee when it's warm  outside)  ? ?Social Determinants of Health  ? ?Financial Resource Strain: Not on file  ?Food Insecurity: Not on file  ?Transportation Needs: Not on file  ?Physical Activity: Not on file  ?Stress: Not on file  ?Social Connections: Not on file  ?Intimate Partner Violence: Not on file  ? ?Family History  ?Problem Relation Age of Onset  ? Lung cancer Mother   ? Cancer Mother   ? Graves' disease Mother   ? Myasthenia gravis Neg Hx   ? ? ?Objective: ?Office vital signs reviewed. ?BP 139/85   Pulse 66   Temp (!) 97.2 ?F (36.2 ?C)   Ht '5\' 9"'$  (1.753 m)   Wt 204 lb 3.2 oz (92.6 kg)   SpO2 97%   BMI 30.16 kg/m?  ? ?Physical Examination:  ?General: Awake, alert, well nourished, No acute distress ?HEENT: Sclera white.  Moist mucous membranes ?Cardio: regular rate and rhythm, S1S2 heard, no murmurs appreciated ?Pulm: clear to auscultation bilaterally, no wheezes, rhonchi or rales; normal work of breathing on room air ?Extremities: warm, well perfused, No edema, cyanosis or clubbing; +2 pulses bilaterally ?Neuro: See diabetic foot  ?Diabetic Foot Exam - Simple   ?Simple Foot Form ?Diabetic Foot exam was performed with the following findings: Yes 01/07/2022  4:05 PM  ?Visual Inspection ?No deformities, no ulcerations, no other skin breakdown bilaterally: Yes ?Sensation Testing ?Intact to touch and monofilament testing bilaterally: Yes ?Pulse Check ?Posterior Tibialis and Dorsalis pulse intact bilaterally: Yes ?Comments ?  ? ? ? ?Assessment/ Plan: ?61 y.o. male  ? ?Uncontrolled type 2 diabetes mellitus with hypoglycemia without coma ([place]) - Plan: Basic Metabolic Panel, Bayer DCA Hb A1c Waived ? ?Hypertension associated with diabetes ([place]) - Plan: Basic Metabolic Panel ? ?Hyperlipidemia associated with type 2 diabetes mellitus ([place]) - Plan: LDL Cholesterol, Direct ? ?Myasthenia gravis ([place]) ? ?Primary insomnia - Plan: zolpidem (AMBIEN) 5 MG tablet, ToxASSURE Select 13 (MW), Urine ? ?Controlled substance agreement signed - Plan:  ToxASSURE Select 13 (MW), Urine ? ?A1c remains uncontrolled but is getting better and is down to 8.5 now.  We discussed blood sugar threshold of less than 150.  He is to continue monitoring blood sugars at home and if blood sugars remain above 150 we will add glipizide extended release 5 mg daily. ? ?Blood pressure now controlled.  No changes.  Check renal function ? ?Direct LDL ordered.  Continue statin ? ?Myasthenia gravis is stable.  Continue follow-up with specialty as directed ? ?Okay to do Ambien 2.5 to 5 mg nightly as needed.  Advised not to use this regularly.  We discussed the risks associated with  this medication and he voiced good understanding.  National narcotic database reviewed and there were no red flags.  Toxassure and CSC were updated as per office policy today. ? ? ?No orders of the defined types were placed in this encounter. ? ?No orders of the defined types were placed in this encounter. ? ? ? ?[name], DO ?[place] ?([phone] ? ?

## 2022-01-08 LAB — BASIC METABOLIC PANEL
BUN/Creatinine Ratio: 19 (ref 10–24)
BUN: 15 mg/dL (ref 8–27)
CO2: 23 mmol/L (ref 20–29)
Calcium: 9.6 mg/dL (ref 8.6–10.2)
Chloride: 100 mmol/L (ref 96–106)
Creatinine, Ser: 0.77 mg/dL (ref 0.76–1.27)
Glucose: 144 mg/dL — ABNORMAL HIGH (ref 70–99)
Potassium: 4.2 mmol/L (ref 3.5–5.2)
Sodium: 144 mmol/L (ref 134–144)
eGFR: 102 mL/min/{1.73_m2} (ref >59)

## 2022-01-08 LAB — LDL CHOLESTEROL, DIRECT: LDL Direct: 103 mg/dL — ABNORMAL HIGH (ref 0–99)

## 2022-01-09 ENCOUNTER — Encounter: Payer: Self-pay | Admitting: Emergency Medicine

## 2022-01-12 LAB — TOXASSURE SELECT 13 (MW), URINE

## 2022-01-31 ENCOUNTER — Encounter: Payer: Self-pay | Admitting: Family Medicine

## 2022-02-07 ENCOUNTER — Other Ambulatory Visit: Payer: Self-pay | Admitting: Family Medicine

## 2022-02-13 ENCOUNTER — Other Ambulatory Visit: Payer: Self-pay | Admitting: Family Medicine

## 2022-02-19 ENCOUNTER — Ambulatory Visit: Payer: BC Managed Care – PPO | Admitting: Family Medicine

## 2022-02-19 ENCOUNTER — Encounter: Payer: Self-pay | Admitting: Family Medicine

## 2022-02-19 VITALS — BP 146/83 | HR 67 | Temp 98.4°F | Resp 20 | Ht 69.0 in | Wt 201.0 lb

## 2022-02-19 DIAGNOSIS — R310 Gross hematuria: Secondary | ICD-10-CM

## 2022-02-19 DIAGNOSIS — Z720 Tobacco use: Secondary | ICD-10-CM

## 2022-02-19 LAB — CBC
Hematocrit: 46.1 % (ref 37.5–51.0)
Hemoglobin: 16.4 g/dL (ref 13.0–17.7)
MCH: 31.3 pg (ref 26.6–33.0)
MCHC: 35.6 g/dL (ref 31.5–35.7)
MCV: 88 fL (ref 79–97)
Platelets: 257 10*3/uL (ref 150–450)
RBC: 5.24 x10E6/uL (ref 4.14–5.80)
RDW: 13 % (ref 11.6–15.4)
WBC: 7.6 10*3/uL (ref 3.4–10.8)

## 2022-02-19 LAB — BASIC METABOLIC PANEL
BUN/Creatinine Ratio: 23 (ref 10–24)
BUN: 18 mg/dL (ref 8–27)
CO2: 22 mmol/L (ref 20–29)
Calcium: 9.4 mg/dL (ref 8.6–10.2)
Chloride: 100 mmol/L (ref 96–106)
Creatinine, Ser: 0.79 mg/dL (ref 0.76–1.27)
Glucose: 129 mg/dL — ABNORMAL HIGH (ref 70–99)
Potassium: 4 mmol/L (ref 3.5–5.2)
Sodium: 139 mmol/L (ref 134–144)
eGFR: 102 mL/min/{1.73_m2} (ref >59)

## 2022-02-19 LAB — URINALYSIS, ROUTINE W REFLEX MICROSCOPIC
Bilirubin, UA: NEGATIVE
Glucose, UA: NEGATIVE
Ketones, UA: NEGATIVE
Leukocytes,UA: NEGATIVE
Nitrite, UA: NEGATIVE
Protein,UA: NEGATIVE
RBC, UA: NEGATIVE
Specific Gravity, UA: 1.01 (ref 1.005–1.030)
Urobilinogen, Ur: 0.2 mg/dL (ref 0.2–1.0)
pH, UA: 7 (ref 5.0–7.5)

## 2022-02-19 NOTE — Patient Instructions (Signed)
No evidence of infection. Given your smoking history, I have referred to urology in [place] for evaluation but this sounds like post coital hematuria (which is more common than you think)  Hematuria, Adult Hematuria is blood in the urine. Blood may be visible in the urine, or it may be identified with a test. This condition can be caused by infections of the bladder, urethra, kidney, or prostate. Other possible causes include: Kidney stones. Cancer of the urinary tract. Too much calcium in the urine. Conditions that are passed from parent to child (inherited conditions). Exercise that requires a lot of energy. Infections can usually be treated with medicine, and a kidney stone usually will pass through your urine. If neither of these is the cause of your hematuria, more tests may be needed to identify the cause of your symptoms. It is very important to tell your health care provider about any blood in your urine, even if it is painless or the blood stops without treatment. Blood in the urine, when it happens and then stops and then happens again, can be a symptom of a very serious condition, including cancer. There is no pain in the initial stages of many urinary cancers. Follow these instructions at home: Medicines Take over-the-counter and prescription medicines only as told by your health care provider. If you were prescribed an antibiotic medicine, take it as told by your health care provider. Do not stop taking the antibiotic even if you start to feel better. Eating and drinking Drink enough fluid to keep your urine pale yellow. It is recommended that you drink 3-4 quarts (2.8-3.8 L) a day. If you have been diagnosed with an infection, drinking cranberry juice in addition to large amounts of water is recommended. Avoid caffeine, tea, and carbonated beverages. These tend to irritate the bladder. Avoid alcohol because it may irritate the prostate (in males). General instructions If you  have been diagnosed with a kidney stone, follow your health care provider's instructions about straining your urine to catch the stone. Empty your bladder often. Avoid holding urine for long periods of time. If you are male: After a bowel movement, wipe from front to back and use each piece of toilet paper only once. Empty your bladder before and after sex. Pay attention to any changes in your symptoms. Tell your health care provider about any changes or any new symptoms. It is up to you to get the results of any tests. Ask your health care provider, or the department that is doing the test, when your results will be ready. Keep all follow-up visits. This is important. Contact a health care provider if: You develop back pain. You have a fever or chills. You have nausea or vomiting. Your symptoms do not improve after 3 days. Your symptoms get worse. Get help right away if: You develop severe vomiting and are unable to take medicine without vomiting. You develop severe pain in your back or abdomen even though you are taking medicine. You pass a large amount of blood in your urine. You pass blood clots in your urine. You feel very weak or like you might faint. You faint. Summary Hematuria is blood in the urine. It has many possible causes. It is very important that you tell your health care provider about any blood in your urine, even if it is painless or the blood stops without treatment. Take over-the-counter and prescription medicines only as told by your health care provider. Drink enough fluid to keep your urine pale yellow.  This information is not intended to replace advice given to you by your health care provider. Make sure you discuss any questions you have with your health care provider. Document Revised: 05/09/2020 Document Reviewed: 05/09/2020 Elsevier Patient Education  [place].

## 2022-02-19 NOTE — Progress Notes (Signed)
Subjective: CC:Hematuria PCP: [name], DO ZOX:WRUE Kenneth Fields is a 61 y.o. male presenting to clinic today for:  1. Hematuria Patient is accompanied today by his wife.  He notes that he had 2 episodes of hematuria earlier this month separated by a couple of weeks each.  He denies any trauma to the pelvis.  No scrotal lesions or swelling.  He notes that he has had a little bit of urgency and most recently some urinary odor.  Sometimes he does not feel like he empties his bladder totally but sometimes he empties his bladder just fine.  Denies any flank pain, dysuria, penile discharge, vomiting or night sweats.  He does have intermittent nausea and increased gas production.  He takes his PPI as directed.  He brings a photograph today which shows a blood clot in his urine.  After further questioning this apparently happened after intercourse both times.  He does smoke 1 pack/day and has done so for several years.   ROS: Per HPI  Allergies  Allergen Reactions   Bee Venom Anaphylaxis   Chantix [Varenicline Tartrate]     "mental problems"   Past Medical History:  Diagnosis Date   COVID-19 08/2020   Diabetes ([place])    High cholesterol    Hypertension    Myasthenia gravis ([place])    Myasthenia gravis ([place])     Current Outpatient Medications:    benazepril (LOTENSIN) 40 MG tablet, TAKE 1 TABLET BY MOUTH EVERY DAY, Disp: 90 tablet, Rfl: 0   cetirizine (ZYRTEC) 10 MG tablet, Take 10 mg by mouth daily as needed for allergies., Disp: , Rfl:    diltiazem (CARDIZEM CD) 120 MG 24 hr capsule, TAKE 1 CAPSULE BY MOUTH EVERY DAY, Disp: 90 capsule, Rfl: 0   hydrochlorothiazide (HYDRODIURIL) 25 MG tablet, Take 1 tablet (25 mg total) by mouth daily., Disp: 90 tablet, Rfl: 3   meclizine (ANTIVERT) 25 MG tablet, Take 1 tablet (25 mg total) by mouth 3 (three) times daily as needed for dizziness., Disp: 90 tablet, Rfl: 6   metFORMIN (GLUCOPHAGE-XR) 750 MG 24 hr tablet, Take 2 tablets (1,500 mg total)  by mouth daily with breakfast., Disp: 180 tablet, Rfl: 1   mycophenolate (CELLCEPT) 500 MG tablet, Take 2 tablets (1,000 mg total) by mouth 2 (two) times daily., Disp: 360 tablet, Rfl: 4   pantoprazole (PROTONIX) 40 MG tablet, TAKE 1 TABLET BY MOUTH EVERY DAY, Disp: 90 tablet, Rfl: 1   rosuvastatin (CRESTOR) 40 MG tablet, TAKE 1 TABLET BY MOUTH EVERY DAY, Disp: 90 tablet, Rfl: 0   VASCEPA 1 g capsule, Take 2 capsules (2 g total) by mouth 2 (two) times daily., Disp: 120 capsule, Rfl: 12   zolpidem (AMBIEN) 5 MG tablet, Take 0.5-1 tablets (2.5-5 mg total) by mouth at bedtime as needed for sleep., Disp: 15 tablet, Rfl: 2 Social History   Socioeconomic History   Marital status: Married    Spouse name: Not on file   Number of children: Not on file   Years of education: Not on file   Highest education level: 11th grade  Occupational History   Not on file  Tobacco Use   Smoking status: Every Day    Packs/day: 1.00    Years: 40.00    Pack years: 40.00    Types: Cigarettes   Smokeless tobacco: Never  Vaping Use   Vaping Use: Never used  Substance and Sexual Activity   Alcohol use: Yes    Alcohol/week: 30.0 standard drinks  Types: 18 Cans of beer, 12 Shots of liquor per week    Comment: 3 beers per night   Drug use: Yes   Sexual activity: Not on file  Other Topics Concern   Not on file  Social History Narrative   Live at home with his wife   Right handed   Caffeine: 1 diet mtn dew daily, occasional tea (decaf), 1 cup of coffee in the mornings (no coffee when it's warm outside)   Social Determinants of Health   Financial Resource Strain: Not on file  Food Insecurity: Not on file  Transportation Needs: Not on file  Physical Activity: Not on file  Stress: Not on file  Social Connections: Not on file  Intimate Partner Violence: Not on file   Family History  Problem Relation Age of Onset   Lung cancer Mother    Cancer Mother    [name]' disease Mother    Myasthenia gravis Neg  Hx     Objective: Office vital signs reviewed. BP (!) 146/83   Pulse 67   Temp 98.4 F (36.9 C) (Temporal)   Resp 20   Ht '5\' 9"'$  (1.753 m)   Wt 201 lb (91.2 kg)   SpO2 99%   BMI 29.68 kg/m   Physical Examination:  General: Awake, alert, nontoxic male, No acute distress HEENT: Sclera white GU: No suprapubic tenderness palpation.  No CVA tenderness palpation.  Assessment/ Plan: 61 y.o. male   Gross hematuria - Plan: Urinalysis, Routine w reflex microscopic, Urine Culture, Basic Metabolic Panel, CBC, Ambulatory referral to Urology  Tobacco use  His urinalysis is totally clear today.  I have gone ahead and sent this for urine culture but I suspect that the hematuria is postcoital hematuria and is likely benign.  That being said, because he has a smoking history cannot totally exclude bladder pathology including malignancy and therefore I placed a referral to urology for further investigation.  Check BMP, CBC.  No orders of the defined types were placed in this encounter.  No orders of the defined types were placed in this encounter.    [name], DO [place] [phone]

## 2022-02-20 LAB — URINE CULTURE: Organism ID, Bacteria: NO GROWTH

## 2022-03-10 ENCOUNTER — Encounter: Payer: Self-pay | Admitting: Urology

## 2022-03-10 ENCOUNTER — Ambulatory Visit: Payer: BC Managed Care – PPO | Admitting: Urology

## 2022-03-10 VITALS — BP 171/87 | HR 67 | Ht 68.0 in | Wt 205.0 lb

## 2022-03-10 DIAGNOSIS — N400 Enlarged prostate without lower urinary tract symptoms: Secondary | ICD-10-CM | POA: Diagnosis not present

## 2022-03-10 DIAGNOSIS — R310 Gross hematuria: Secondary | ICD-10-CM

## 2022-03-10 LAB — URINALYSIS, ROUTINE W REFLEX MICROSCOPIC
Bilirubin, UA: NEGATIVE
Glucose, UA: NEGATIVE
Ketones, UA: NEGATIVE
Leukocytes,UA: NEGATIVE
Nitrite, UA: NEGATIVE
Protein,UA: NEGATIVE
RBC, UA: NEGATIVE
Specific Gravity, UA: 1.01 (ref 1.005–1.030)
Urobilinogen, Ur: 0.2 mg/dL (ref 0.2–1.0)
pH, UA: 6.5 (ref 5.0–7.5)

## 2022-03-10 NOTE — Progress Notes (Signed)
03/10/2022 11:12 AM   Delma Officer [birth year] [phone]  Referring provider: [name], DO [address],  [place] [address]  No chief complaint on file.   HPI:  New patient-  1) gross hematuria-patient noted red urine and a small clot. He had two episodes. Both times after sex. Urinalysis was done in the office May 2023 which was negative and bland.  Creatinine 0.79.  Urine culture was negative. He is smoker. He did spray bed rails. No chemo or xrt/ No h/o kidney stones. No GU surgery.   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   PMH: Past Medical History:  Diagnosis Date   COVID-19 08/2020   Diabetes ([place])    High cholesterol    Hypertension    Myasthenia gravis ([place])    Myasthenia gravis ([place])     Surgical History: Past Surgical History:  Procedure Laterality Date   ELBOW SURGERY Right    for tendonitis   TYMPANOSTOMY TUBE PLACEMENT Bilateral    as a child    WISDOM TOOTH EXTRACTION      Home Medications:  Allergies as of 03/10/2022       Reactions   Bee Venom Anaphylaxis   Chantix [varenicline Tartrate]    "mental problems"        Medication List        Accurate as of March 10, 2022 11:12 AM. If you have any questions, ask your nurse or doctor.          benazepril 40 MG tablet Commonly known as: LOTENSIN TAKE 1 TABLET BY MOUTH EVERY DAY   cetirizine 10 MG tablet Commonly known as: ZYRTEC Take 10 mg by mouth daily as needed for allergies.   diltiazem 120 MG 24 hr capsule Commonly known as: CARDIZEM CD TAKE 1 CAPSULE BY MOUTH EVERY DAY   hydrochlorothiazide 25 MG tablet Commonly known as: HYDRODIURIL Take 1 tablet (25 mg total) by mouth daily.   meclizine 25 MG tablet Commonly known as: ANTIVERT Take 1 tablet (25 mg total) by mouth 3 (three) times daily as needed for dizziness.   metFORMIN 750 MG 24 hr tablet Commonly known as: GLUCOPHAGE-XR Take 2 tablets (1,500 mg total) by mouth daily with breakfast.   mycophenolate  500 MG tablet Commonly known as: CELLCEPT Take 2 tablets (1,000 mg total) by mouth 2 (two) times daily.   pantoprazole 40 MG tablet Commonly known as: PROTONIX TAKE 1 TABLET BY MOUTH EVERY DAY   rosuvastatin 40 MG tablet Commonly known as: CRESTOR TAKE 1 TABLET BY MOUTH EVERY DAY   Vascepa 1 g capsule Generic drug: icosapent Ethyl Take 2 capsules (2 g total) by mouth 2 (two) times daily.   zolpidem 5 MG tablet Commonly known as: AMBIEN Take 0.5-1 tablets (2.5-5 mg total) by mouth at bedtime as needed for sleep.        Allergies:  Allergies  Allergen Reactions   Bee Venom Anaphylaxis   Chantix [Varenicline Tartrate]     "mental problems"    Family History: Family History  Problem Relation Age of Onset   Lung cancer Mother    Cancer Mother    [name]' disease Mother    Myasthenia gravis Neg Hx     Social History:  reports that he has been smoking cigarettes. He has a 40.00 pack-year smoking history. He has never used smokeless tobacco. He reports current alcohol use of about 30.0 standard drinks of alcohol per week. He reports current drug use.  Physical Exam: BP (!) 171/87   Pulse 67   Ht '5\' 8"'$  (1.727 m)   Wt 205 lb (93 kg)   BMI 31.17 kg/m   Constitutional:  Alert and oriented, No acute distress. HEENT: [place] AT, moist mucus membranes.  Trachea midline, no masses. Cardiovascular: No clubbing, cyanosis, or edema. Respiratory: Normal respiratory effort, no increased work of breathing. GI: Abdomen is soft, nontender, nondistended, no abdominal masses, small bilateral R>L inguinal hernia GU: No CVA tenderness Lymph: No cervical or inguinal lymphadenopathy. Skin: No rashes, bruises or suspicious lesions. Neurologic: Grossly intact, no focal deficits, moving all 4 extremities. Psychiatric: Normal mood and affect. DRE: prostate was 40 g and benign - no hard area or nodule   Laboratory Data: Lab Results  Component Value Date   WBC 7.6 02/19/2022   HGB 16.4  02/19/2022   HCT 46.1 02/19/2022   MCV 88 02/19/2022   PLT 257 02/19/2022    Lab Results  Component Value Date   CREATININE 0.79 02/19/2022    No results found for: "PSA"  No results found for: "TESTOSTERONE"  Lab Results  Component Value Date   HGBA1C 8.5 (H) 01/07/2022    Urinalysis    Component Value Date/Time   APPEARANCEUR Clear 02/19/2022 0939   GLUCOSEU Negative 02/19/2022 0939   BILIRUBINUR Negative 02/19/2022 0939   PROTEINUR Negative 02/19/2022 0939   NITRITE Negative 02/19/2022 0939   LEUKOCYTESUR Negative 02/19/2022 0939    No results found for: "LABMICR", "WBCUA", "RBCUA", "LABEPIT", "MUCUS", "BACTERIA"  Pertinent Imaging: N/a   Assessment & Plan:    1. Gross hematuria Discussed the nature r/b/a to cystoscopy and CT with IV contrast and the importance of ruling out bladder and kidney cancer. Also disc this is another good reason to stop smoking - link between GU ca and smoking.  - Urinalysis, Routine w reflex microscopic  2. Bph - PSA was sent   No follow-ups on file.  Festus Aloe, MD  [facility]  [address] [place], [place] [address] [phone]

## 2022-03-11 LAB — PSA: Prostate Specific Ag, Serum: 0.9 ng/mL (ref 0.0–4.0)

## 2022-04-07 ENCOUNTER — Other Ambulatory Visit: Payer: BC Managed Care – PPO | Admitting: Urology

## 2022-04-08 ENCOUNTER — Encounter: Payer: Self-pay | Admitting: Family Medicine

## 2022-04-08 ENCOUNTER — Ambulatory Visit: Payer: BC Managed Care – PPO | Admitting: Family Medicine

## 2022-04-08 VITALS — BP 143/76 | HR 64 | Temp 97.3°F | Ht 68.0 in | Wt 206.0 lb

## 2022-04-08 DIAGNOSIS — E785 Hyperlipidemia, unspecified: Secondary | ICD-10-CM | POA: Diagnosis not present

## 2022-04-08 DIAGNOSIS — E1169 Type 2 diabetes mellitus with other specified complication: Secondary | ICD-10-CM | POA: Diagnosis not present

## 2022-04-08 DIAGNOSIS — E11649 Type 2 diabetes mellitus with hypoglycemia without coma: Secondary | ICD-10-CM | POA: Diagnosis not present

## 2022-04-08 DIAGNOSIS — I152 Hypertension secondary to endocrine disorders: Secondary | ICD-10-CM | POA: Diagnosis not present

## 2022-04-08 DIAGNOSIS — E1159 Type 2 diabetes mellitus with other circulatory complications: Secondary | ICD-10-CM

## 2022-04-08 LAB — BAYER DCA HB A1C WAIVED: HB A1C (BAYER DCA - WAIVED): 6.2 % — ABNORMAL HIGH (ref 4.8–5.6)

## 2022-04-08 NOTE — Patient Instructions (Addendum)
6.2 GREAT JOB!!

## 2022-04-08 NOTE — Progress Notes (Signed)
Subjective: CC:DM PCP: [name], DO PYP:PJKD Kenneth Fields is a 61 y.o. male presenting to clinic today for:  1. Type 2 Diabetes with hypertension, hyperlipidemia:  Compliant with metformin extended release 750 mg twice daily.  No diarrheal symptoms.  No chest pain, shortness of breath, visual disturbance.  No sensory changes.  No foot ulcers.  Last eye exam: Needs Last foot exam: Up-to-date Last A1c:  Lab Results  Component Value Date   HGBA1C 8.5 (H) 01/07/2022   Nephropathy screen indicated?:  Up-to-date Last flu, zoster and/or pneumovax:  Immunization History  Administered Date(s) Administered   Janssen (J&J) SARS-COV-2 Vaccination 01/23/2020   Pneumococcal Polysaccharide-23 07/23/2014   Tdap 09/22/2009, 07/13/2010, 02/14/2013     ROS: Per HPI  Allergies  Allergen Reactions   Bee Venom Anaphylaxis   Chantix [Varenicline Tartrate]     "mental problems"   Past Medical History:  Diagnosis Date   COVID-19 08/2020   Diabetes ([place])    High cholesterol    Hypertension    Myasthenia gravis ([place])    Myasthenia gravis ([place])     Current Outpatient Medications:    benazepril (LOTENSIN) 40 MG tablet, TAKE 1 TABLET BY MOUTH EVERY DAY, Disp: 90 tablet, Rfl: 0   cetirizine (ZYRTEC) 10 MG tablet, Take 10 mg by mouth daily as needed for allergies., Disp: , Rfl:    diltiazem (CARDIZEM CD) 120 MG 24 hr capsule, TAKE 1 CAPSULE BY MOUTH EVERY DAY, Disp: 90 capsule, Rfl: 0   hydrochlorothiazide (HYDRODIURIL) 25 MG tablet, Take 1 tablet (25 mg total) by mouth daily., Disp: 90 tablet, Rfl: 3   meclizine (ANTIVERT) 25 MG tablet, Take 1 tablet (25 mg total) by mouth 3 (three) times daily as needed for dizziness., Disp: 90 tablet, Rfl: 6   metFORMIN (GLUCOPHAGE-XR) 750 MG 24 hr tablet, Take 2 tablets (1,500 mg total) by mouth daily with breakfast., Disp: 180 tablet, Rfl: 1   mycophenolate (CELLCEPT) 500 MG tablet, Take 2 tablets (1,000 mg total) by mouth 2 (two) times daily., Disp:  360 tablet, Rfl: 4   pantoprazole (PROTONIX) 40 MG tablet, TAKE 1 TABLET BY MOUTH EVERY DAY, Disp: 90 tablet, Rfl: 1   rosuvastatin (CRESTOR) 40 MG tablet, TAKE 1 TABLET BY MOUTH EVERY DAY, Disp: 90 tablet, Rfl: 0   VASCEPA 1 g capsule, Take 2 capsules (2 g total) by mouth 2 (two) times daily., Disp: 120 capsule, Rfl: 12   zolpidem (AMBIEN) 5 MG tablet, Take 0.5-1 tablets (2.5-5 mg total) by mouth at bedtime as needed for sleep., Disp: 15 tablet, Rfl: 2 Social History   Socioeconomic History   Marital status: Married    Spouse name: Not on file   Number of children: Not on file   Years of education: Not on file   Highest education level: 11th grade  Occupational History   Not on file  Tobacco Use   Smoking status: Every Day    Packs/day: 1.00    Years: 40.00    Total pack years: 40.00    Types: Cigarettes   Smokeless tobacco: Never  Vaping Use   Vaping Use: Never used  Substance and Sexual Activity   Alcohol use: Yes    Alcohol/week: 30.0 standard drinks of alcohol    Types: 18 Cans of beer, 12 Shots of liquor per week    Comment: 3 beers per night   Drug use: Yes   Sexual activity: Not on file  Other Topics Concern   Not on file  Social History Narrative   Live at home with his wife   Right handed   Caffeine: 1 diet mtn dew daily, occasional tea (decaf), 1 cup of coffee in the mornings (no coffee when it's warm outside)   Social Determinants of Health   Financial Resource Strain: Not on file  Food Insecurity: Not on file  Transportation Needs: Not on file  Physical Activity: Not on file  Stress: Not on file  Social Connections: Not on file  Intimate Partner Violence: Not on file   Family History  Problem Relation Age of Onset   Lung cancer Mother    Cancer Mother    [name]' disease Mother    Myasthenia gravis Neg Hx     Objective: Office vital signs reviewed. BP (!) 143/76   Pulse 64   Temp (!) 97.3 F (36.3 C)   Ht '5\' 8"'$  (1.727 m)   Wt 206 lb (93.4 kg)    SpO2 97%   BMI 31.32 kg/m   Physical Examination:  General: Awake, alert, well nourished, No acute distress HEENT: Sclera white.  Moist mucous membranes Cardio: regular rate and rhythm, S1S2 heard, no murmurs appreciated Pulm: clear to auscultation bilaterally, no wheezes, rhonchi or rales; normal work of breathing on room air  Assessment/ Plan: 61 y.o. male   Controlled type 2 diabetes mellitus with other specified complication, without long-term current use of insulin ([place]) - Plan: Bayer DCA Hb A1c Waived  Hypertension associated with diabetes ([place])  Hyperlipidemia associated with type 2 diabetes mellitus ([place])  Sugar now under excellent control with A1c below 7.  I congratulated him on this that he may extend his follow-up to every 4 to 6 months going forward.  He needs to get his diabetic eye exam done.  Blood pressure not controlled.  No changes for now but if blood pressure not improved by next checkup we may need to advance his blood pressure regimen.  Continue Vascepa, Crestor for cholesterol control  No orders of the defined types were placed in this encounter.  No orders of the defined types were placed in this encounter.    [name], DO [place] [phone]

## 2022-04-16 ENCOUNTER — Ambulatory Visit (HOSPITAL_COMMUNITY): Payer: BC Managed Care – PPO

## 2022-04-28 ENCOUNTER — Other Ambulatory Visit: Payer: BC Managed Care – PPO | Admitting: Urology

## 2022-05-12 ENCOUNTER — Other Ambulatory Visit: Payer: Self-pay | Admitting: Family Medicine

## 2022-05-12 DIAGNOSIS — K219 Gastro-esophageal reflux disease without esophagitis: Secondary | ICD-10-CM

## 2022-05-13 ENCOUNTER — Other Ambulatory Visit: Payer: Self-pay | Admitting: Family Medicine

## 2022-05-19 ENCOUNTER — Telehealth: Payer: Self-pay

## 2022-05-19 NOTE — Chronic Care Management (AMB) (Signed)
  Care Coordination  Note  05/19/2022 Name: Kenneth Fields MRN: [id number] DOB: [birth year]  Kenneth Fields is a 61 y.o. year old male who is a primary care patient of [name], DO. I reached out to [employer] by phone today to offer care coordination services.      [name] was given information about Care Coordination services today including:  The Care Coordination services include support from the care team which includes your Nurse Coordinator, Clinical Social Worker, or Pharmacist.  The Care Coordination team is here to help remove barriers to the health concerns and goals most important to you. Care Coordination services are voluntary and the patient may decline or stop services at any time by request to their care team member.   Patient did not agree to participate in care coordination services at this time.  Follow up plan: Patient declines further follow up or participation in care coordination services.   Noreene Larsson, [place], [address] [address] Direct Dial: [phone] [email or web address]

## 2022-05-29 ENCOUNTER — Other Ambulatory Visit: Payer: Self-pay | Admitting: Family

## 2022-05-29 DIAGNOSIS — E11649 Type 2 diabetes mellitus with hypoglycemia without coma: Secondary | ICD-10-CM

## 2022-07-30 ENCOUNTER — Other Ambulatory Visit: Payer: Self-pay | Admitting: Nurse Practitioner

## 2022-07-30 DIAGNOSIS — I10 Essential (primary) hypertension: Secondary | ICD-10-CM

## 2022-08-06 ENCOUNTER — Encounter: Payer: Self-pay | Admitting: Family Medicine

## 2022-08-06 ENCOUNTER — Ambulatory Visit: Payer: BC Managed Care – PPO | Admitting: Family Medicine

## 2022-08-06 VITALS — BP 146/73 | HR 75 | Temp 97.3°F | Ht 68.0 in | Wt 206.0 lb

## 2022-08-06 DIAGNOSIS — I152 Hypertension secondary to endocrine disorders: Secondary | ICD-10-CM | POA: Diagnosis not present

## 2022-08-06 DIAGNOSIS — E1169 Type 2 diabetes mellitus with other specified complication: Secondary | ICD-10-CM

## 2022-08-06 DIAGNOSIS — F5101 Primary insomnia: Secondary | ICD-10-CM

## 2022-08-06 DIAGNOSIS — E1159 Type 2 diabetes mellitus with other circulatory complications: Secondary | ICD-10-CM | POA: Diagnosis not present

## 2022-08-06 DIAGNOSIS — T380X5A Adverse effect of glucocorticoids and synthetic analogues, initial encounter: Secondary | ICD-10-CM

## 2022-08-06 DIAGNOSIS — G7000 Myasthenia gravis without (acute) exacerbation: Secondary | ICD-10-CM | POA: Diagnosis not present

## 2022-08-06 DIAGNOSIS — E785 Hyperlipidemia, unspecified: Secondary | ICD-10-CM

## 2022-08-06 DIAGNOSIS — Z5320 Procedure and treatment not carried out because of patient's decision for unspecified reasons: Secondary | ICD-10-CM

## 2022-08-06 DIAGNOSIS — E099 Drug or chemical induced diabetes mellitus without complications: Secondary | ICD-10-CM | POA: Diagnosis not present

## 2022-08-06 MED ORDER — METFORMIN HCL ER 750 MG PO TB24: ORAL_TABLET | ORAL | 3 refills | Status: DC

## 2022-08-06 MED ORDER — VASCEPA 1 G PO CAPS: 2.0000 g | ORAL_CAPSULE | Freq: Two times a day (BID) | ORAL | 12 refills | Status: DC

## 2022-08-06 MED ORDER — ZOLPIDEM TARTRATE 5 MG PO TABS: 2.5000 mg | ORAL_TABLET | Freq: Every evening | ORAL | 2 refills | Status: DC | PRN

## 2022-08-06 MED ORDER — BENAZEPRIL HCL 40 MG PO TABS: 40.0000 mg | ORAL_TABLET | Freq: Every day | ORAL | 3 refills | Status: DC

## 2022-08-06 MED ORDER — HYDROCHLOROTHIAZIDE 25 MG PO TABS: 25.0000 mg | ORAL_TABLET | Freq: Every day | ORAL | 3 refills | Status: DC

## 2022-08-06 MED ORDER — DILTIAZEM HCL ER COATED BEADS 120 MG PO CP24: ORAL_CAPSULE | ORAL | 3 refills | Status: DC

## 2022-08-06 NOTE — Progress Notes (Signed)
Subjective: CC:Dm PCP: [name], DO NWG:NFAO Kenneth Fields is a 61 y.o. male presenting to clinic today for:  1. Type 2 Diabetes with hypertension, hyperlipidemia:  Reports that he discontinued Crestor because he felt like he was on too many medications and wanted to try naturopathic way of reducing his cholesterol.  He admits that he does not totally follow a strict diet but really does try to have a low-carb diet with exception of occasional pasta and rice.  He does not really eat bread or sugar.  Compliant with blood pressure medications and Vascepa.  He is currently taking cayenne pepper, olive oil, apple cider vinegar and honey in efforts to naturally reduce his cholesterol  Last eye exam: Needs Last foot exam: Up-to-date Last A1c:  Lab Results  Component Value Date   HGBA1C 6.2 (H) 04/08/2022   Nephropathy screen indicated?:  Up-to-date Last flu, zoster and/or pneumovax:  Immunization History  Administered Date(s) Administered   Janssen (J&J) SARS-COV-2 Vaccination 01/23/2020   Pneumococcal Polysaccharide-23 07/23/2014   Tdap 09/22/2009, 07/13/2010, 02/14/2013    ROS: No chest pain, shortness of breath or blurred vision reported.  He has not had any recurrent hematuria  ROS: Per HPI  Allergies  Allergen Reactions   Bee Venom Anaphylaxis   Chantix [Varenicline Tartrate]     "mental problems"   Past Medical History:  Diagnosis Date   COVID-19 08/2020   Diabetes ([place])    High cholesterol    Hypertension    Myasthenia gravis ([place])    Myasthenia gravis ([place])     Current Outpatient Medications:    benazepril (LOTENSIN) 40 MG tablet, TAKE 1 TABLET BY MOUTH EVERY DAY, Disp: 90 tablet, Rfl: 0   cetirizine (ZYRTEC) 10 MG tablet, Take 10 mg by mouth daily as needed for allergies., Disp: , Rfl:    diltiazem (CARDIZEM CD) 120 MG 24 hr capsule, TAKE 1 CAPSULE BY MOUTH EVERY DAY, Disp: 90 capsule, Rfl: 0   hydrochlorothiazide (HYDRODIURIL) 25 MG tablet, TAKE 1 TABLET  (25 MG TOTAL) BY MOUTH DAILY., Disp: 90 tablet, Rfl: 0   meclizine (ANTIVERT) 25 MG tablet, Take 1 tablet (25 mg total) by mouth 3 (three) times daily as needed for dizziness., Disp: 90 tablet, Rfl: 6   metFORMIN (GLUCOPHAGE-XR) 750 MG 24 hr tablet, TAKE 2 TABLETS BY MOUTH ONCE DAILY WITH BREAKFAST, Disp: 180 tablet, Rfl: 0   mycophenolate (CELLCEPT) 500 MG tablet, Take 2 tablets (1,000 mg total) by mouth 2 (two) times daily., Disp: 360 tablet, Rfl: 4   pantoprazole (PROTONIX) 40 MG tablet, TAKE 1 TABLET BY MOUTH EVERY DAY, Disp: 90 tablet, Rfl: 1   VASCEPA 1 g capsule, Take 2 capsules (2 g total) by mouth 2 (two) times daily., Disp: 120 capsule, Rfl: 12   zolpidem (AMBIEN) 5 MG tablet, Take 0.5-1 tablets (2.5-5 mg total) by mouth at bedtime as needed for sleep., Disp: 15 tablet, Rfl: 2 Social History   Socioeconomic History   Marital status: Married    Spouse name: Not on file   Number of children: Not on file   Years of education: Not on file   Highest education level: 11th grade  Occupational History   Not on file  Tobacco Use   Smoking status: Every Day    Packs/day: 1.00    Years: 40.00    Total pack years: 40.00    Types: Cigarettes   Smokeless tobacco: Never  Vaping Use   Vaping Use: Never used  Substance and Sexual  Activity   Alcohol use: Yes    Alcohol/week: 30.0 standard drinks of alcohol    Types: 18 Cans of beer, 12 Shots of liquor per week    Comment: 3 beers per night   Drug use: Yes   Sexual activity: Not on file  Other Topics Concern   Not on file  Social History Narrative   Live at home with his wife   Right handed   Caffeine: 1 diet mtn dew daily, occasional tea (decaf), 1 cup of coffee in the mornings (no coffee when it's warm outside)   Social Determinants of Health   Financial Resource Strain: Not on file  Food Insecurity: Not on file  Transportation Needs: Not on file  Physical Activity: Not on file  Stress: Not on file  Social Connections: Not on  file  Intimate Partner Violence: Not on file   Family History  Problem Relation Age of Onset   Lung cancer Mother    Cancer Mother    [name]' disease Mother    Myasthenia gravis Neg Hx     Objective: Office vital signs reviewed. BP (!) 146/73   Pulse 75   Temp (!) 97.3 F (36.3 C)   Ht _0  (1.727 m)   Wt 206 lb (93.4 kg)   SpO2 94%   BMI 31.32 kg/m   Physical Examination:  General: Awake, alert, well nourished, No acute distress HEENT: Sclera white.  Moist mucous membranes Cardio: regular rate and rhythm, S1S2 heard, no murmurs appreciated Pulm: clear to auscultation bilaterally, no wheezes, rhonchi or rales; normal work of breathing on room air Neuro: No tremor  Assessment/ Plan: 61 y.o. male   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 ([place]) - Plan: CMP14+EGFR, Bayer DCA Hb A1c Waived, metFORMIN (GLUCOPHAGE-XR) 750 MG 24 hr tablet  Hypertension associated with diabetes ([place]) - Plan: CMP14+EGFR, benazepril (LOTENSIN) 40 MG tablet, diltiazem (CARDIZEM CD) 120 MG 24 hr capsule, hydrochlorothiazide (HYDRODIURIL) 25 MG tablet  Hyperlipidemia associated with type 2 diabetes mellitus ([place]) - Plan: CMP14+EGFR, VASCEPA 1 g capsule  Refusal of statin medication by patient  Myasthenia gravis ([place])  Primary insomnia - Plan: zolpidem (AMBIEN) 5 MG tablet  Diabetes remains under control but has a slight bump with A1c up to 6.7.  Recommended carbohydrate restriction and increase physical exercise.  We discussed the risks of not utilizing a statin as a type II diabetic including increased cardiovascular risks.  For now he wishes to continue naturopathic remedies and will consider restarting the statin pending his repeat labs in the spring.  Blood pressure is not controlled upon recheck.  May need to consider adding additional medication but he is very reluctant to take more medications.  He will work on lifestyle modification and we will reconvene in 4 to 6 months  for recheck  Myasthenia gravis is chronic and stable.  I am glad to collect any labs at his specialist may need going forward  Very very sparing use of the Ambien.  Given that this will be expired soon I have sent over a new prescription to place on hold.  He still has a few tablets left of his bottle from April.  Up-to-date on UDS and CSC and national narcotic database was reviewed and there were no red flags  Orders Placed This Encounter  Procedures   CMP14+EGFR   Bayer DCA Hb A1c Waived   No orders of the defined types were placed in this encounter.    [name], DO  [place] [phone]

## 2022-08-07 LAB — BAYER DCA HB A1C WAIVED: HB A1C (BAYER DCA - WAIVED): 6.7 % — ABNORMAL HIGH (ref 4.8–5.6)

## 2022-08-07 LAB — CMP14+EGFR
ALT: 26 IU/L (ref 0–44)
AST: 20 IU/L (ref 0–40)
Albumin/Globulin Ratio: 2.4 — ABNORMAL HIGH (ref 1.2–2.2)
Albumin: 4.5 g/dL (ref 3.9–4.9)
Alkaline Phosphatase: 92 IU/L (ref 44–121)
BUN/Creatinine Ratio: 22 (ref 10–24)
BUN: 16 mg/dL (ref 8–27)
Bilirubin Total: 0.3 mg/dL (ref 0.0–1.2)
CO2: 21 mmol/L (ref 20–29)
Calcium: 9.5 mg/dL (ref 8.6–10.2)
Chloride: 98 mmol/L (ref 96–106)
Creatinine, Ser: 0.72 mg/dL — ABNORMAL LOW (ref 0.76–1.27)
Globulin, Total: 1.9 g/dL (ref 1.5–4.5)
Glucose: 217 mg/dL — ABNORMAL HIGH (ref 70–99)
Potassium: 3.9 mmol/L (ref 3.5–5.2)
Sodium: 136 mmol/L (ref 134–144)
Total Protein: 6.4 g/dL (ref 6.0–8.5)
eGFR: 104 mL/min/{1.73_m2} (ref >59)

## 2022-08-23 ENCOUNTER — Other Ambulatory Visit: Payer: Self-pay | Admitting: Family Medicine

## 2022-08-23 DIAGNOSIS — K219 Gastro-esophageal reflux disease without esophagitis: Secondary | ICD-10-CM

## 2022-10-10 DIAGNOSIS — H35361 Drusen (degenerative) of macula, right eye: Secondary | ICD-10-CM | POA: Diagnosis not present

## 2022-10-10 LAB — HM DIABETES EYE EXAM

## 2022-10-23 ENCOUNTER — Other Ambulatory Visit: Payer: Self-pay | Admitting: Neurology

## 2022-10-23 DIAGNOSIS — G7000 Myasthenia gravis without (acute) exacerbation: Secondary | ICD-10-CM

## 2022-11-08 ENCOUNTER — Other Ambulatory Visit: Payer: Self-pay | Admitting: Neurology

## 2022-11-08 DIAGNOSIS — G7000 Myasthenia gravis without (acute) exacerbation: Secondary | ICD-10-CM

## 2022-11-10 NOTE — Telephone Encounter (Signed)
LVM for patient to call back and make a f/u appointment with [name] in office

## 2022-12-09 ENCOUNTER — Encounter: Payer: Self-pay | Admitting: Family Medicine

## 2022-12-09 ENCOUNTER — Ambulatory Visit (INDEPENDENT_AMBULATORY_CARE_PROVIDER_SITE_OTHER): Payer: BC Managed Care – PPO | Admitting: Family Medicine

## 2022-12-09 VITALS — BP 120/70 | HR 67 | Temp 98.6°F | Ht 68.0 in | Wt 203.0 lb

## 2022-12-09 DIAGNOSIS — I152 Hypertension secondary to endocrine disorders: Secondary | ICD-10-CM

## 2022-12-09 DIAGNOSIS — F17210 Nicotine dependence, cigarettes, uncomplicated: Secondary | ICD-10-CM

## 2022-12-09 DIAGNOSIS — E099 Drug or chemical induced diabetes mellitus without complications: Secondary | ICD-10-CM

## 2022-12-09 DIAGNOSIS — Z0001 Encounter for general adult medical examination with abnormal findings: Secondary | ICD-10-CM | POA: Diagnosis not present

## 2022-12-09 DIAGNOSIS — E785 Hyperlipidemia, unspecified: Secondary | ICD-10-CM

## 2022-12-09 DIAGNOSIS — Z79899 Other long term (current) drug therapy: Secondary | ICD-10-CM

## 2022-12-09 DIAGNOSIS — F5101 Primary insomnia: Secondary | ICD-10-CM

## 2022-12-09 DIAGNOSIS — Z5320 Procedure and treatment not carried out because of patient's decision for unspecified reasons: Secondary | ICD-10-CM

## 2022-12-09 DIAGNOSIS — K219 Gastro-esophageal reflux disease without esophagitis: Secondary | ICD-10-CM

## 2022-12-09 DIAGNOSIS — T380X5A Adverse effect of glucocorticoids and synthetic analogues, initial encounter: Secondary | ICD-10-CM

## 2022-12-09 DIAGNOSIS — I700 Atherosclerosis of aorta: Secondary | ICD-10-CM | POA: Diagnosis not present

## 2022-12-09 DIAGNOSIS — E1159 Type 2 diabetes mellitus with other circulatory complications: Secondary | ICD-10-CM

## 2022-12-09 DIAGNOSIS — K5909 Other constipation: Secondary | ICD-10-CM

## 2022-12-09 DIAGNOSIS — E1169 Type 2 diabetes mellitus with other specified complication: Secondary | ICD-10-CM

## 2022-12-09 DIAGNOSIS — G7000 Myasthenia gravis without (acute) exacerbation: Secondary | ICD-10-CM

## 2022-12-09 DIAGNOSIS — Z0000 Encounter for general adult medical examination without abnormal findings: Secondary | ICD-10-CM

## 2022-12-09 LAB — BAYER DCA HB A1C WAIVED: HB A1C (BAYER DCA - WAIVED): 6.4 % — ABNORMAL HIGH (ref 4.8–5.6)

## 2022-12-09 MED ORDER — LINACLOTIDE 72 MCG PO CAPS: 72.0000 ug | ORAL_CAPSULE | Freq: Every day | ORAL | 0 refills | Status: DC

## 2022-12-09 MED ORDER — ZOLPIDEM TARTRATE 5 MG PO TABS: 2.5000 mg | ORAL_TABLET | Freq: Every evening | ORAL | 2 refills | Status: DC | PRN

## 2022-12-09 MED ORDER — PANTOPRAZOLE SODIUM 40 MG PO TBEC: 40.0000 mg | DELAYED_RELEASE_TABLET | Freq: Every day | ORAL | 3 refills | Status: DC

## 2022-12-09 NOTE — Patient Instructions (Signed)
Lung Cancer Screening A lung cancer screening is a test that checks for lung cancer when there are no symptoms or history of that disease. The screening is done to look for lung cancer in its very early stages. Finding cancer early improves the chances of successful treatment. It may save your life. Who should have a screening? You should be screened for lung cancer if all of these apply: You currently smoke, or you have quit smoking within the past 15 years. You are between the ages of 50 and 80 years old. Screening may be recommended up to age 80 depending on your overall health and other factors. You have a smoking history of 1 pack of cigarettes a day for 20 years or 2 packs a day for 10 years. How is screening done?  The recommended screening test is a low-dose computed tomography (LDCT) scan. This scan takes detailed images of the lungs. This allows a health care provider to look for abnormal cells. If you are at risk for lung cancer, it is recommended that you get screened once a year. Talk to your health care provider about the risks, benefits, and limitations of screening. What are the benefits of screening? Screening can find lung cancer early, before symptoms start and before it has spread outside of the lungs. The chances of curing lung cancer are greater if the cancer is diagnosed early. What are the risks of screening? The screening may show lung cancer when no cancer is present. Talk with your health care provider about what your results mean. In some cases, your health care provider may do more testing to confirm the results. The screening may not find lung cancer when it is present. You will be exposed to radiation from repeated LDCT tests, which can cause cancer in otherwise healthy people. How can I lower my risk of lung cancer? Make these lifestyle changes to lower your risk of developing lung cancer: Do not use any products that contain nicotine or tobacco. These products  include cigarettes, chewing tobacco, and vaping devices, such as e-cigarettes. If you need help quitting, ask your health care provider. Avoid secondhand smoke. Avoid exposure to radiation. Avoid exposure to radon gas. Have your home checked for radon regularly. Avoid things that cause cancer (carcinogens). Avoid living or working in places with high air pollution or diesel exhaust. Questions to ask your health care provider Am I eligible for lung cancer screening? Does my health insurance cover the cost of lung cancer screening? What happens if the lung cancer screening shows something of concern? How soon will I have results from my lung cancer screening? Is there anything that I need to do to prepare for my lung cancer screening? What happens if I decide not to have lung cancer screening? Where to find more information Ask your health care provider about the risks and benefits of screening. More information and resources are available from these organizations: American Cancer Society (ACS): www.cancer.org American Lung Association: www.lung.org National Cancer Institute: www.cancer.gov Contact a health care provider if: You start to show symptoms of lung cancer, including: A cough that will not go away. High-pitched whistling sounds when you breathe, most often when you breathe out (wheezing). Chest pain. Coughing up blood. Shortness of breath. Weight loss that cannot be explained. Constant tiredness (fatigue). Hoarse voice. Summary Lung cancer screening may find lung cancer before symptoms appear. Finding cancer early improves the chances of successful treatment. It may save your life. The recommended screening test is a low-dose   computed tomography (LDCT) scan that looks for abnormal cells in the lungs. If you are at risk for lung cancer, it is recommended that you get screened once a year. You can make lifestyle changes to lower your risk of lung cancer. Ask your health care  provider about the risks and benefits of screening. This information is not intended to replace advice given to you by your health care provider. Make sure you discuss any questions you have with your health care provider. Document Revised: 02/27/2021 Document Reviewed: 02/27/2021 Elsevier Patient Education  2023 [employer]   

## 2022-12-09 NOTE — Progress Notes (Signed)
Kenneth Fields is a 62 y.o. male presents to office today for annual physical exam examination.    Concerns today include: 1. .Type 2 Diabetes with hypertension, hyperlipidemia:  Patient reports compliance with with metformin 750 mg extended release 2 tablets daily, hydrochlorothiazide, diltiazem, Lotensin, Vascepa.    He reports blood sugars range typically around the low 100s.  Never above 120 and the lowest he seen a 66 x 2.  He did feel symptomatic at that time.  He is only been taking 2 of the Vascepa at once daily however.  Did not realize this was the twice daily dosing.  He has quit drinking since our last visit has been off of liquor totally for 3 months.  Was previously drinking 3/5 of liquor per week.  He continues to smoke roughly 1 pack/day.  He has been a smoker of 1 pack/day for at least 10 years and prior to that at least 1/2 pack/day for 30 years.  He denies any hemoptysis, unplanned weight loss, night sweats, shortness of breath or chest pain  Last eye exam: UTD Last foot exam: UTD Last A1c:  Lab Results  Component Value Date   HGBA1C 6.7 (H) 08/06/2022   Nephropathy screen indicated?: needs Last flu, zoster and/or pneumovax:  Immunization History  Administered Date(s) Administered   Janssen (J&J) SARS-COV-2 Vaccination 01/23/2020   Pneumococcal Polysaccharide-23 07/23/2014   Tdap 09/22/2009, 07/13/2010, 02/14/2013    2.  Insomnia Patient reports sparing use of Ambien.  His only refilled the prescription once since our last visit.  Denies any excessive daytime sedation, falls or respiratory depression with medications.  No sleepwalking.  3.  Constipation Patient reports this is a chronic issue for him.  No blood in stool.  No associated nausea or vomiting.  He takes Dulcolax roughly once per week in efforts to have a good bowel movement.  Diet: High-fiber, Exercise: No structured Last colonoscopy: UTD Refills needed today: ambien Immunizations  needed: Immunization History  Administered Date(s) Administered   Engineer, maintenance (J&J) SARS-COV-2 Vaccination 01/23/2020   Pneumococcal Polysaccharide-23 07/23/2014   Tdap 09/22/2009, 07/13/2010, 02/14/2013     Past Medical History:  Diagnosis Date   COVID-19 08/2020   Diabetes ([place])    High cholesterol    Hypertension    Myasthenia gravis ([place])    Myasthenia gravis ([place])    Social History   Socioeconomic History   Marital status: Married    Spouse name: Not on file   Number of children: Not on file   Years of education: Not on file   Highest education level: 11th grade  Occupational History   Not on file  Tobacco Use   Smoking status: Every Day    Packs/day: 1.00    Years: 40.00    Additional pack years: 0.00    Total pack years: 40.00    Types: Cigarettes   Smokeless tobacco: Never  Vaping Use   Vaping Use: Never used  Substance and Sexual Activity   Alcohol use: Yes    Alcohol/week: 30.0 standard drinks of alcohol    Types: 18 Cans of beer, 12 Shots of liquor per week    Comment: 3 beers per night   Drug use: Yes   Sexual activity: Not on file  Other Topics Concern   Not on file  Social History Narrative   Live at home with his wife   Right handed   Caffeine: 1 diet mtn dew daily, occasional tea (decaf), 1 cup of coffee in the  mornings (no coffee when it's warm outside)   Social Determinants of Health   Financial Resource Strain: Not on file  Food Insecurity: Not on file  Transportation Needs: Not on file  Physical Activity: Not on file  Stress: Not on file  Social Connections: Not on file  Intimate Partner Violence: Not on file   Past Surgical History:  Procedure Laterality Date   ELBOW SURGERY Right    for tendonitis   TYMPANOSTOMY TUBE PLACEMENT Bilateral    as a child    WISDOM TOOTH EXTRACTION     Family History  Problem Relation Age of Onset   Lung cancer Mother    Cancer Mother    [name]' disease Mother    Myasthenia gravis Neg Hx      Current Outpatient Medications:    benazepril (LOTENSIN) 40 MG tablet, Take 1 tablet (40 mg total) by mouth daily., Disp: 90 tablet, Rfl: 3   cetirizine (ZYRTEC) 10 MG tablet, Take 10 mg by mouth daily as needed for allergies., Disp: , Rfl:    diltiazem (CARDIZEM CD) 120 MG 24 hr capsule, TAKE 1 CAPSULE BY MOUTH EVERY DAY, Disp: 90 capsule, Rfl: 3   hydrochlorothiazide (HYDRODIURIL) 25 MG tablet, Take 1 tablet (25 mg total) by mouth daily., Disp: 90 tablet, Rfl: 3   meclizine (ANTIVERT) 25 MG tablet, Take 1 tablet (25 mg total) by mouth 3 (three) times daily as needed for dizziness., Disp: 90 tablet, Rfl: 6   metFORMIN (GLUCOPHAGE-XR) 750 MG 24 hr tablet, TAKE 2 TABLETS BY MOUTH ONCE DAILY WITH BREAKFAST, Disp: 180 tablet, Rfl: 3   mycophenolate (CELLCEPT) 500 MG tablet, TAKE 2 TABLETS BY MOUTH TWICE DAILY, Disp: 120 tablet, Rfl: 5   pantoprazole (PROTONIX) 40 MG tablet, TAKE 1 TABLET BY MOUTH EVERY DAY, Disp: 90 tablet, Rfl: 1   VASCEPA 1 g capsule, Take 2 capsules (2 g total) by mouth 2 (two) times daily., Disp: 120 capsule, Rfl: 12   zolpidem (AMBIEN) 5 MG tablet, Take 0.5-1 tablets (2.5-5 mg total) by mouth at bedtime as needed for sleep (put on file)., Disp: 15 tablet, Rfl: 2  Allergies  Allergen Reactions   Bee Venom Anaphylaxis   Chantix [Varenicline Tartrate]     "mental problems"     ROS: Review of Systems Pertinent items noted in HPI and remainder of comprehensive ROS otherwise negative.    Physical exam BP 120/70   Pulse 67   Temp 98.6 F (37 C)   Ht 5\' 8"  (1.727 m)   Wt 203 lb (92.1 kg)   SpO2 98%   BMI 30.87 kg/m  General appearance: alert, cooperative, appears stated age, and no distress Head: Normocephalic, without obvious abnormality, atraumatic Eyes: negative findings: lids and lashes normal, conjunctivae and sclerae normal, corneas clear, and pupils equal, round, reactive to light and accomodation Ears: normal TM's and external ear canals both ears Nose:  Nares normal. Septum midline. Mucosa normal. No drainage or sinus tenderness. Throat: lips, mucosa, and tongue normal; teeth and gums normal; missing some teeth Neck: no adenopathy, no carotid bruit, supple, symmetrical, trachea midline, and thyroid not enlarged, symmetric, no tenderness/mass/nodules Back: symmetric, no curvature. ROM normal. No CVA tenderness. Lungs: clear to auscultation bilaterally Chest wall: no tenderness Heart: regular rate and rhythm, S1, S2 normal, no murmur, click, rub or gallop Abdomen:  Obese, nontender, no palpable hepatosplenomegaly Extremities: extremities normal, atraumatic, no cyanosis or edema Pulses: 2+ and symmetric Skin: Skin color, texture, turgor normal. No rashes or lesions Lymph nodes:  Cervical, supraclavicular, and axillary nodes normal. Neurologic: Grossly normal Psych: Mood stable, speech normal, affect appropriate.  Very pleasant, interactive     12/09/2022    8:03 AM 08/06/2022    3:57 PM 04/08/2022    3:36 PM  Depression screen PHQ 2/9  Decreased Interest 0 0 0  Down, Depressed, Hopeless 0 0 0  PHQ - 2 Score 0 0 0  Altered sleeping 0  0  Tired, decreased energy 0  0  Change in appetite 0  0  Feeling bad or failure about yourself  0  0  Trouble concentrating 0  0  Moving slowly or fidgety/restless 0  0  Suicidal thoughts 0  0  PHQ-9 Score 0  0  Difficult doing work/chores Not difficult at all  Not difficult at all      12/09/2022    8:03 AM 08/06/2022    3:57 PM 04/08/2022    3:36 PM 02/19/2022    9:27 AM  GAD 7 : Generalized Anxiety Score  Nervous, Anxious, on Edge 0 0 0 0  Control/stop worrying 0 0 0 0  Worry too much - different things 0 0 0 0  Trouble relaxing 0 0 0 0  Restless 0 0 0 0  Easily annoyed or irritable 0 0 0 0  Afraid - awful might happen 0 0 0 0  Total GAD 7 Score 0 0 0 0  Anxiety Difficulty  Not difficult at all Not difficult at all Not difficult at all   Assessment/ Plan: Kenneth Fields here for annual  physical exam.   Annual physical exam  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 ([place]) - Plan: Microalbumin / creatinine urine ratio, Bayer DCA Hb A1c Waived, CMP14+EGFR, CBC  Hypertension associated with diabetes ([place]) - Plan: CMP14+EGFR  Hyperlipidemia associated with type 2 diabetes mellitus ([place]) - Plan: Lipid Panel, CMP14+EGFR  Aortic atherosclerosis ([facility])  Refusal of statin medication by patient  Primary insomnia - Plan: TSH, ToxASSURE Select 13 (MW), Urine, zolpidem (AMBIEN) 5 MG tablet  Controlled substance agreement signed - Plan: ToxASSURE Select 13 (MW), Urine  Myasthenia gravis ([facility])  Heavy tobacco smoker >10 cigarettes per day  Chronic constipation - Plan: linaclotide (LINZESS) 72 MCG capsule  Gastroesophageal reflux disease without esophagitis - Plan: pantoprazole (PROTONIX) 40 MG tablet  Sugar under good control with A1c of 6.4 today.  He can try backing down to just 1 metformin daily since he had a couple of hypoglycemic episodes.  Blood pressure controlled upon recheck.  Refills through November.  Check fasting labs, renal function  Check fasting lipid.  Continue Vascepa for now but wonder if he would benefit from [place] given aortic atherosclerosis and what appears to be coronary artery disease on that 2020 CAT scan of his chest.  UDS, CSC updated as per office policy.  Reviewed this in detail with the patient today.  Ambien renewed.  National narcotic database reviewed and there were no red flags  Counseled on smoking cessation he is actively working on this.  Discussed lung cancer screening with the patient today and he is going to contemplate this  Chronic constipation ongoing despite use of OTC medications, high-fiber diet and adequate water intake.  Trial of Linzess.  Not sure that his insurance will pay for this particular medication but we could consider Amitiza which appears to be covered.  He will let me know if this is  efficacious  Counseled on healthy lifestyle choices, including diet (rich in fruits, vegetables  and lean meats and low in salt and simple carbohydrates) and exercise (at least 30 minutes of moderate physical activity daily).  Patient to follow up in 76m for DM recheck  Kenneth Tinkle M. Lajuana Ripple, DO

## 2022-12-10 LAB — CMP14+EGFR
ALT: 26 IU/L (ref 0–44)
AST: 33 IU/L (ref 0–40)
Albumin/Globulin Ratio: 2.3 — ABNORMAL HIGH (ref 1.2–2.2)
Albumin: 4.9 g/dL (ref 3.9–4.9)
Alkaline Phosphatase: 79 IU/L (ref 44–121)
BUN/Creatinine Ratio: 24 (ref 10–24)
BUN: 22 mg/dL (ref 8–27)
Bilirubin Total: 0.5 mg/dL (ref 0.0–1.2)
CO2: 19 mmol/L — ABNORMAL LOW (ref 20–29)
Calcium: 10.3 mg/dL — ABNORMAL HIGH (ref 8.6–10.2)
Chloride: 98 mmol/L (ref 96–106)
Creatinine, Ser: 0.93 mg/dL (ref 0.76–1.27)
Globulin, Total: 2.1 g/dL (ref 1.5–4.5)
Glucose: 125 mg/dL — ABNORMAL HIGH (ref 70–99)
Potassium: 4.7 mmol/L (ref 3.5–5.2)
Sodium: 136 mmol/L (ref 134–144)
Total Protein: 7 g/dL (ref 6.0–8.5)
eGFR: 93 mL/min/{1.73_m2} (ref >59)

## 2022-12-10 LAB — CBC
Hematocrit: 47.8 % (ref 37.5–51.0)
Hemoglobin: 17.2 g/dL (ref 13.0–17.7)
MCH: 31.7 pg (ref 26.6–33.0)
MCHC: 36 g/dL — ABNORMAL HIGH (ref 31.5–35.7)
MCV: 88 fL (ref 79–97)
Platelets: 297 10*3/uL (ref 150–450)
RBC: 5.42 x10E6/uL (ref 4.14–5.80)
RDW: 12 % (ref 11.6–15.4)
WBC: 8.2 10*3/uL (ref 3.4–10.8)

## 2022-12-10 LAB — LIPID PANEL
Chol/HDL Ratio: 7.9 ratio — ABNORMAL HIGH (ref 0.0–5.0)
Cholesterol, Total: 267 mg/dL — ABNORMAL HIGH (ref 100–199)
HDL: 34 mg/dL — ABNORMAL LOW (ref >39)
LDL Chol Calc (NIH): 132 mg/dL — ABNORMAL HIGH (ref 0–99)
Triglycerides: 547 mg/dL — ABNORMAL HIGH (ref 0–149)
VLDL Cholesterol Cal: 101 mg/dL — ABNORMAL HIGH (ref 5–40)

## 2022-12-10 LAB — TSH: TSH: 1.05 u[IU]/mL (ref 0.450–4.500)

## 2022-12-10 LAB — MICROALBUMIN / CREATININE URINE RATIO
Creatinine, Urine: 111.6 mg/dL
Microalb/Creat Ratio: 4 mg/g creat (ref 0–29)
Microalbumin, Urine: 4.4 ug/mL

## 2022-12-12 LAB — TOXASSURE SELECT 13 (MW), URINE

## 2023-01-05 ENCOUNTER — Ambulatory Visit: Payer: BC Managed Care – PPO | Admitting: Neurology

## 2023-01-05 ENCOUNTER — Encounter: Payer: Self-pay | Admitting: Neurology

## 2023-01-05 DIAGNOSIS — G7000 Myasthenia gravis without (acute) exacerbation: Secondary | ICD-10-CM

## 2023-01-05 MED ORDER — MYCOPHENOLATE MOFETIL 500 MG PO TABS: 1000.0000 mg | ORAL_TABLET | Freq: Two times a day (BID) | ORAL | 5 refills | Status: DC

## 2023-01-05 NOTE — Patient Instructions (Signed)
Mycophenolate Capsules What is this medication? MYCOPHENOLATE (mye koe FEN oh late) prevents the body from rejecting an organ transplant. It works by lowering the body's immune system response. This helps the body accept the donor organ. It belongs to a group of medications called immunosuppressants. This medicine may be used for other purposes; ask your health care provider or pharmacist if you have questions. COMMON BRAND NAME(S): CellCept What should I tell my care team before I take this medication? They need to know if you have any of these conditions: Anemia Blood disorder Cancer Diarrhea Immune system problems Infection, such as chickenpox, cold sores, herpes Kidney disease Recent or upcoming vaccine Stomach problems An unusual or allergic reaction to mycophenolate, other medications, foods, dyes, or preservatives Pregnant or trying to get pregnant Breastfeeding How should I use this medication? Take this medication by mouth with a full glass of water. Follow the directions on the prescription label. Take this medication on an empty stomach, at least 1 hour before or 2 hours after food. Do not take with food unless your care team approves. Swallow the medication whole. Do not cut, crush, or chew the medication. If the medication is broken or is not intact, do not get the powder on your skin or eyes. If contact occurs, rinse thoroughly with water. Take your medication at regular intervals. Do not take your medication more often than directed. Do not stop taking except on your care team's advice. A special MedGuide will be given to you by the pharmacist with each prescription and refill. Be sure to read this information carefully each time. Talk to your care team about the use of this medication in children. While this medication may be prescribed for selected conditions, precautions do apply. Overdosage: If you think you have taken too much of this medicine contact a poison control  center or emergency room at once. NOTE: This medicine is only for you. Do not share this medicine with others. What if I miss a dose? If you miss a dose, take it as soon as you can. If it is almost time for your next dose, take only that dose. Do not take double or extra doses. What may interact with this medication? Do not take this medication with any of the following: Live virus vaccines This medication may also interact with the following: Acyclovir or valacyclovir Azathioprine Certain antibiotics, such as ciprofloxacin, levofloxacin, norfloxacin, trimethoprim; sulfamethoxazole, penicillin, amoxicillin; clavulanic acid Certain medications for stomach problems, such as lansoprazole, omeprazole, pantoprazole Cyclosporine Estrogen and progestin hormones Ganciclovir or valganciclovir Isavuconazonium Medications for cholesterol, such as cholestyramine or colestipol Metronidazole Other medications that contain mycophenolate Probenecid Rifampin Sevelamer Stomach acid blockers, such as magnesium hydroxide or aluminum hydroxide Telmisartan This list may not describe all possible interactions. Give your health care provider a list of all the medicines, herbs, non-prescription drugs, or dietary supplements you use. Also tell them if you smoke, drink alcohol, or use illegal drugs. Some items may interact with your medicine. What should I watch for while using this medication? Visit your care team for regular checks on your progress. You will need frequent blood checks during the first few months you are receiving the medication. This medication may affect your coordination, reaction time, or judgment. Do not drive or operate machinery until you know how this medication affects you. Sit up or stand slowly to reduce the risk of dizzy or fainting spells. Drinking alcohol with this medication can increase the risk of these side effects. This medication may  increase your risk of getting an infection.  Call your care team for advice if you get a fever, chills, sore throat, or other symptoms of a cold or flu. Do not treat yourself. Try to avoid being around people who are sick. This medication can make you more sensitive to the sun. Keep out of the sun. If you cannot avoid being in the sun, wear protective clothing and sunscreen. Do not use sun lamps, tanning beds, or tanning booths. Talk to your care team about your risk of cancer. You may be more at risk for certain types of cancer if you take this medication. Talk to your care team if you or your partner may be pregnant. Serious birth defects can occur if you take this medication during pregnancy and for 6 weeks after the last dose. You will need a negative pregnancy test before starting this medication. Estrogen and progestin hormones may not work as well while you are taking this medication. Contraception is recommended while taking this medication and for 6 weeks after the last dose. Your care team can help you find the option that works for you. If your partner can get pregnant, use a condom during sex while taking this medication and for 90 days after the last dose. Talk to your care team before breastfeeding. Changes to your treatment plan may be needed. Do not donate sperm while taking this medication and for 90 days after the last dose. Do not donate blood while you are taking this medication and for 6 weeks after the last dose. Donated blood may contain enough of this medication to cause birth defects in a fetus if transfused to someone who is pregnant. What side effects may I notice from receiving this medication? Side effects that you should report to your care team as soon as possible: Allergic reactions--skin rash, itching, hives, swelling of the face, lips, tongue, or throat Infection--fever, chills, cough, sore throat, wounds that don't heal, pain or trouble when passing urine, general feeling of discomfort or being unwell Joint,  muscle, or tendon pain, swelling, or stiffness Low red blood cell level--unusual weakness or fatigue, dizziness, headache, trouble breathing Peptic ulcer--burning stomach pain, loss of appetite, bloating, burping, heartburn, nausea, vomiting Stomach bleeding--bloody or black, tar-like stools, vomiting blood or brown material that looks like coffee grounds Stomach pain that is severe, does not go away, or gets worse Unusual bruising or bleeding Side effects that usually do not require medical attention (report to your care team if they continue or are bothersome): Diarrhea Dizziness Headache Nausea Swelling of the ankles, hands, or feet Tremors or shaking Trouble sleeping This list may not describe all possible side effects. Call your doctor for medical advice about side effects. You may report side effects to FDA at 1-800-FDA-1088. Where should I keep my medication? Keep out of the reach of children and pets. Store at room temperature between 20 and 25 degrees C (68 and 77 degrees F). Get rid of any unused medication after the expiration date. To get rid of medications that are no longer needed or have expired: Take the medication to a medication take-back program. Check with your pharmacy or law enforcement to find a location. If you cannot return the medication, ask your pharmacist or care team how to get rid of this medication safely. NOTE: This sheet is a summary. It may not cover all possible information. If you have questions about this medicine, talk to your doctor, pharmacist, or health care provider.  2023 Elsevier/Gold Standard (  2022-01-30 00:00:00)  
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
## 2023-02-09 ENCOUNTER — Telehealth: Payer: Self-pay | Admitting: Neurology

## 2023-02-09 NOTE — Telephone Encounter (Signed)
Sent mychart msg informing pt of appt change due to provider schedule change 

## 2023-04-10 ENCOUNTER — Encounter: Payer: Self-pay | Admitting: Family Medicine

## 2023-04-10 ENCOUNTER — Ambulatory Visit: Payer: BC Managed Care – PPO | Admitting: Family Medicine

## 2023-04-10 VITALS — BP 140/68 | HR 61 | Temp 98.3°F | Ht 68.0 in | Wt 207.0 lb

## 2023-04-10 DIAGNOSIS — G7000 Myasthenia gravis without (acute) exacerbation: Secondary | ICD-10-CM

## 2023-04-10 DIAGNOSIS — E099 Drug or chemical induced diabetes mellitus without complications: Secondary | ICD-10-CM

## 2023-04-10 DIAGNOSIS — E785 Hyperlipidemia, unspecified: Secondary | ICD-10-CM

## 2023-04-10 DIAGNOSIS — E1169 Type 2 diabetes mellitus with other specified complication: Secondary | ICD-10-CM

## 2023-04-10 DIAGNOSIS — I152 Hypertension secondary to endocrine disorders: Secondary | ICD-10-CM

## 2023-04-10 DIAGNOSIS — E1159 Type 2 diabetes mellitus with other circulatory complications: Secondary | ICD-10-CM | POA: Diagnosis not present

## 2023-04-10 LAB — BAYER DCA HB A1C WAIVED: HB A1C (BAYER DCA - WAIVED): 6.9 % — ABNORMAL HIGH (ref 4.8–5.6)

## 2023-04-10 MED ORDER — DILTIAZEM HCL ER COATED BEADS 120 MG PO CP24
ORAL_CAPSULE | ORAL | 3 refills | Status: DC
Start: 2023-04-10 — End: 2023-12-15

## 2023-04-10 MED ORDER — VASCEPA 1 G PO CAPS
2.0000 g | ORAL_CAPSULE | Freq: Two times a day (BID) | ORAL | 12 refills | Status: DC
Start: 2023-04-10 — End: 2023-12-15

## 2023-04-10 MED ORDER — HYDROCHLOROTHIAZIDE 25 MG PO TABS
25.0000 mg | ORAL_TABLET | Freq: Every day | ORAL | 3 refills | Status: DC
Start: 2023-04-10 — End: 2023-12-15

## 2023-04-10 MED ORDER — BENAZEPRIL HCL 40 MG PO TABS: 40.0000 mg | ORAL_TABLET | Freq: Every day | ORAL | 3 refills | Status: DC

## 2023-04-10 MED ORDER — ROSUVASTATIN CALCIUM 10 MG PO TABS: 10.0000 mg | ORAL_TABLET | Freq: Every day | ORAL | 3 refills | Status: DC

## 2023-04-10 MED ORDER — METFORMIN HCL ER 750 MG PO TB24
ORAL_TABLET | ORAL | 3 refills | Status: DC
Start: 2023-04-10 — End: 2023-12-15

## 2023-04-10 NOTE — Patient Instructions (Signed)
You're borderline today with sugar.  Watch sugar intake so you don't go uncontrolled. A1c 6.9 today.  Goal is less than 7.0.

## 2023-04-10 NOTE — Progress Notes (Signed)
Subjective: CC:DM PCP: Raliegh Ip, DO ZOX:WRUE Kenneth Fields is a 62 y.o. male presenting to clinic today for:  1. Type 2 Diabetes with hypertension, hyperlipidemia:  He reports compliance with his HCTZ, metformin, Vascepa and benazepril.  He does report excessive fatigue and admits that he is not on CPAP.  Saw Dr. Lucia Gaskins for this in the past but apparently there was some issue getting the CPAP machine.  He has been lost to follow-up since.  He reports blood sugars have been running anywhere from 80-148.  Last eye exam: UTD Last foot exam: needs Last A1c:  Lab Results  Component Value Date   HGBA1C 6.4 (H) 12/09/2022   Nephropathy screen indicated?: UTD Last flu, zoster and/or pneumovax:  Immunization History  Administered Date(s) Administered   Janssen (J&J) SARS-COV-2 Vaccination 01/23/2020   Pneumococcal Polysaccharide-23 07/23/2014   Tdap 09/22/2009, 07/13/2010, 02/14/2013    ROS: Denies dizziness, LOC, polyuria, polydipsia, unintended weight loss/gain, foot ulcerations, numbness or tingling in extremities, shortness of breath or chest pain.    ROS: Per HPI  Allergies  Allergen Reactions   Bee Venom Anaphylaxis   Chantix [Varenicline Tartrate]     "mental problems"   Past Medical History:  Diagnosis Date   COVID-19 08/2020   Diabetes (HCC)    High cholesterol    Hypertension    Myasthenia gravis (HCC)    Myasthenia gravis (HCC)     Current Outpatient Medications:    benazepril (LOTENSIN) 40 MG tablet, Take 1 tablet (40 mg total) by mouth daily., Disp: 90 tablet, Rfl: 3   cetirizine (ZYRTEC) 10 MG tablet, Take 10 mg by mouth daily as needed for allergies., Disp: , Rfl:    diltiazem (CARDIZEM CD) 120 MG 24 hr capsule, TAKE 1 CAPSULE BY MOUTH EVERY DAY, Disp: 90 capsule, Rfl: 3   hydrochlorothiazide (HYDRODIURIL) 25 MG tablet, Take 1 tablet (25 mg total) by mouth daily., Disp: 90 tablet, Rfl: 3   meclizine (ANTIVERT) 25 MG tablet, Take 1 tablet (25 mg total)  by mouth 3 (three) times daily as needed for dizziness., Disp: 90 tablet, Rfl: 6   metFORMIN (GLUCOPHAGE-XR) 750 MG 24 hr tablet, TAKE 2 TABLETS BY MOUTH ONCE DAILY WITH BREAKFAST, Disp: 180 tablet, Rfl: 3   mycophenolate (CELLCEPT) 500 MG tablet, Take 2 tablets (1,000 mg total) by mouth 2 (two) times daily., Disp: 360 tablet, Rfl: 5   pantoprazole (PROTONIX) 40 MG tablet, Take 1 tablet (40 mg total) by mouth daily., Disp: 90 tablet, Rfl: 3   VASCEPA 1 g capsule, Take 2 capsules (2 g total) by mouth 2 (two) times daily., Disp: 120 capsule, Rfl: 12   zolpidem (AMBIEN) 5 MG tablet, Take 0.5-1 tablets (2.5-5 mg total) by mouth at bedtime as needed for sleep (put on file)., Disp: 15 tablet, Rfl: 2 Social History   Socioeconomic History   Marital status: Married    Spouse name: Not on file   Number of children: Not on file   Years of education: Not on file   Highest education level: 11th grade  Occupational History   Not on file  Tobacco Use   Smoking status: Every Day    Current packs/day: 1.00    Average packs/day: 1 pack/day for 40.0 years (40.0 ttl pk-yrs)    Types: Cigarettes   Smokeless tobacco: Never  Vaping Use   Vaping status: Never Used  Substance and Sexual Activity   Alcohol use: Yes    Alcohol/week: 30.0 standard drinks of alcohol  Types: 18 Cans of beer, 12 Shots of liquor per week    Comment: 3 beers per night   Drug use: Yes   Sexual activity: Not on file  Other Topics Concern   Not on file  Social History Narrative   Live at home with his wife   Right handed   Caffeine: 1 diet mtn dew daily, occasional tea (decaf), 1 cup of coffee in the mornings (no coffee when it's warm outside)   Social Determinants of Health   Financial Resource Strain: Not on file  Food Insecurity: Not on file  Transportation Needs: Not on file  Physical Activity: Not on file  Stress: Not on file  Social Connections: Not on file  Intimate Partner Violence: Unknown (12/26/2021)    Received from [facility], [facility]   HITS    Physically Hurt: Not on file    Insult or Talk Down To: Not on file    Threaten Physical Harm: Not on file    Scream or Curse: Not on file   Family History  Problem Relation Age of Onset   Lung cancer Mother    Cancer Mother    Luiz Blare' disease Mother    Myasthenia gravis Neg Hx     Objective: Office vital signs reviewed. BP (!) 140/68 Comment: home BP  Pulse 61   Temp 98.3 F (36.8 C)   Ht 5\' 8"  (1.727 m)   Wt 207 lb (93.9 kg)   SpO2 97%   BMI 31.47 kg/m   Physical Examination:  General: Awake, alert, well nourished, No acute distress HEENT: sclera white, MMM.  No conjunctival pallor.  No goiter Cardio: regular rate and rhythm, S1S2 heard, no murmurs appreciated Pulm: clear to auscultation bilaterally, no wheezes, rhonchi or rales; normal work of breathing on room air Neuro: see DM foot Diabetic Foot Exam - Simple   Simple Foot Form Diabetic Foot exam was performed with the following findings: Yes 04/10/2023  3:42 PM  Visual Inspection No deformities, no ulcerations, no other skin breakdown bilaterally: Yes Sensation Testing Intact to touch and monofilament testing bilaterally: Yes Pulse Check Posterior Tibialis and Dorsalis pulse intact bilaterally: Yes Comments    Lab Results  Component Value Date   HGBA1C 6.9 (H) 04/10/2023    Assessment/ Plan: 62 y.o. male   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 (HCC) - Plan: Bayer DCA Hb A1c Waived, metFORMIN (GLUCOPHAGE-XR) 750 MG 24 hr tablet  Ocular myasthenia gravis (HCC)  Hypertension associated with diabetes (HCC) - Plan: benazepril (LOTENSIN) 40 MG tablet, diltiazem (CARDIZEM CD) 120 MG 24 hr capsule, hydrochlorothiazide (HYDRODIURIL) 25 MG tablet  Hyperlipidemia associated with type 2 diabetes mellitus (HCC) - Plan: LDL Cholesterol, Direct, rosuvastatin (CRESTOR) 10 MG tablet, VASCEPA 1 g capsule, Lipid panel  Hypercalcemia - Plan:  CMP14+EGFR  Sugar is at goal with A1c of 6.9.  Continue current regimen.  Thankfully no recurrent use of prednisone as his ocular myasthenia gravis has been quiet  Blood pressure borderline but technically controlled.  No changes needed.  Renewals of medications have been sent  Check direct LDL given nonfasting status.  Rosuvastatin and Vascepa recommended.  Rx sent  Check calcium level given history of hypercalcemia on previous lab draw  Orders Placed This Encounter  Procedures   Bayer DCA Hb A1c Waived   CMP14+EGFR   LDL Cholesterol, Direct   No orders of the defined types were placed in this encounter.    Raliegh Ip, DO [facility] [phone]

## 2023-04-11 LAB — CMP14+EGFR
ALT: 24 IU/L (ref 0–44)
AST: 21 IU/L (ref 0–40)
Albumin: 4.3 g/dL (ref 3.9–4.9)
Alkaline Phosphatase: 84 IU/L (ref 44–121)
BUN/Creatinine Ratio: 19 (ref 10–24)
BUN: 17 mg/dL (ref 8–27)
Bilirubin Total: 0.3 mg/dL (ref 0.0–1.2)
CO2: 21 mmol/L (ref 20–29)
Calcium: 9.6 mg/dL (ref 8.6–10.2)
Chloride: 100 mmol/L (ref 96–106)
Creatinine, Ser: 0.9 mg/dL (ref 0.76–1.27)
Globulin, Total: 1.8 g/dL (ref 1.5–4.5)
Glucose: 117 mg/dL — ABNORMAL HIGH (ref 70–99)
Potassium: 4.1 mmol/L (ref 3.5–5.2)
Sodium: 137 mmol/L (ref 134–144)
Total Protein: 6.1 g/dL (ref 6.0–8.5)
eGFR: 97 mL/min/{1.73_m2} (ref >59)

## 2023-04-11 LAB — LDL CHOLESTEROL, DIRECT: LDL Direct: 143 mg/dL — ABNORMAL HIGH (ref 0–99)

## 2023-04-20 ENCOUNTER — Telehealth: Payer: Self-pay | Admitting: Neurology

## 2023-04-20 NOTE — Telephone Encounter (Signed)
Received below staff message from Dr. Lucia Gaskins asking me to schedule pt for f/u on OSA with either Dr. Vickey Huger or NP. First available was with Dr. Vickey Huger for 07/07/23 at 2:30, scheduled pt and added to the wait list.  Anson Fret, MD  Judi Cong, RN; Jeronimo Greaves; Yancey Flemings, [place] I will ask Valli Glance to call and get him an appointment with an NP or Dr. Vickey Huger (not me, I have no idea about treating sleep not my area :)

## 2023-04-22 ENCOUNTER — Other Ambulatory Visit: Payer: BC Managed Care – PPO

## 2023-04-22 DIAGNOSIS — E1169 Type 2 diabetes mellitus with other specified complication: Secondary | ICD-10-CM

## 2023-04-22 DIAGNOSIS — E785 Hyperlipidemia, unspecified: Secondary | ICD-10-CM | POA: Diagnosis not present

## 2023-04-23 ENCOUNTER — Other Ambulatory Visit: Payer: Self-pay | Admitting: Family Medicine

## 2023-04-23 DIAGNOSIS — E785 Hyperlipidemia, unspecified: Secondary | ICD-10-CM

## 2023-06-26 ENCOUNTER — Other Ambulatory Visit: Payer: Self-pay | Admitting: Family Medicine

## 2023-06-26 DIAGNOSIS — Z1212 Encounter for screening for malignant neoplasm of rectum: Secondary | ICD-10-CM

## 2023-06-26 DIAGNOSIS — Z1211 Encounter for screening for malignant neoplasm of colon: Secondary | ICD-10-CM

## 2023-07-07 ENCOUNTER — Ambulatory Visit: Payer: BC Managed Care – PPO | Admitting: Neurology

## 2023-07-21 DIAGNOSIS — R059 Cough, unspecified: Secondary | ICD-10-CM | POA: Diagnosis not present

## 2023-07-21 DIAGNOSIS — M940 Chondrocostal junction syndrome [Tietze]: Secondary | ICD-10-CM | POA: Diagnosis not present

## 2023-07-21 DIAGNOSIS — J209 Acute bronchitis, unspecified: Secondary | ICD-10-CM | POA: Diagnosis not present

## 2023-08-17 ENCOUNTER — Encounter: Payer: Self-pay | Admitting: Family Medicine

## 2023-08-17 ENCOUNTER — Ambulatory Visit: Payer: BC Managed Care – PPO | Admitting: Family Medicine

## 2023-08-17 VITALS — BP 145/85 | HR 68 | Temp 98.7°F | Ht 68.0 in | Wt 202.2 lb

## 2023-08-17 DIAGNOSIS — E1159 Type 2 diabetes mellitus with other circulatory complications: Secondary | ICD-10-CM | POA: Diagnosis not present

## 2023-08-17 DIAGNOSIS — Z7984 Long term (current) use of oral hypoglycemic drugs: Secondary | ICD-10-CM | POA: Diagnosis not present

## 2023-08-17 DIAGNOSIS — E119 Type 2 diabetes mellitus without complications: Secondary | ICD-10-CM | POA: Insufficient documentation

## 2023-08-17 DIAGNOSIS — G7000 Myasthenia gravis without (acute) exacerbation: Secondary | ICD-10-CM

## 2023-08-17 DIAGNOSIS — I700 Atherosclerosis of aorta: Secondary | ICD-10-CM | POA: Diagnosis not present

## 2023-08-17 DIAGNOSIS — E1169 Type 2 diabetes mellitus with other specified complication: Secondary | ICD-10-CM

## 2023-08-17 DIAGNOSIS — E785 Hyperlipidemia, unspecified: Secondary | ICD-10-CM

## 2023-08-17 DIAGNOSIS — I152 Hypertension secondary to endocrine disorders: Secondary | ICD-10-CM

## 2023-08-17 LAB — BAYER DCA HB A1C WAIVED: HB A1C (BAYER DCA - WAIVED): 6.5 % — ABNORMAL HIGH (ref 4.8–5.6)

## 2023-08-17 MED ORDER — LOSARTAN POTASSIUM 100 MG PO TABS: 100.0000 mg | ORAL_TABLET | Freq: Every day | ORAL | 3 refills | Status: DC

## 2023-08-17 NOTE — Progress Notes (Signed)
Subjective: CC:DM PCP: Raliegh Ip, DO ZOX:WRUE MYKAL BEDNARCZYK is a 62 y.o. male presenting to clinic today for:  1. Type 2 Diabetes with hypertension, hyperlipidemia/ myasthenia gravis:  Reports compliance with all medications.  Reports that his blood pressure actually was quite high such that he got seen in the urgent care.  He notes that the blood pressure has been fairly elevated despite compliance with triple therapy.  Would like to switch off some of the medication if possible  Diabetes Health Maintenance Due  Topic Date Due   OPHTHALMOLOGY EXAM  10/11/2023   HEMOGLOBIN A1C  02/14/2024   FOOT EXAM  04/09/2024    Last A1c:  Lab Results  Component Value Date   HGBA1C 6.5 (H) 08/17/2023    ROS: Denies dizziness, LOC, polyuria, polydipsia, unintended weight loss/gain, foot ulcerations, numbness or tingling in extremities, shortness of breath or chest pain.  Has had some floaters.  Saw ophthalmology recently.  Reports that myasthenia overall has been stable   ROS: Per HPI  Allergies  Allergen Reactions   Bee Venom Anaphylaxis   Chantix [Varenicline Tartrate]     "mental problems"   Past Medical History:  Diagnosis Date   COVID-19 08/2020   Diabetes (HCC)    High cholesterol    Hypertension    Myasthenia gravis (HCC)    Myasthenia gravis (HCC)     Current Outpatient Medications:    benazepril (LOTENSIN) 40 MG tablet, Take 1 tablet (40 mg total) by mouth daily., Disp: 90 tablet, Rfl: 3   cetirizine (ZYRTEC) 10 MG tablet, Take 10 mg by mouth daily as needed for allergies., Disp: , Rfl:    diltiazem (CARDIZEM CD) 120 MG 24 hr capsule, TAKE 1 CAPSULE BY MOUTH EVERY DAY, Disp: 90 capsule, Rfl: 3   hydrochlorothiazide (HYDRODIURIL) 25 MG tablet, Take 1 tablet (25 mg total) by mouth daily., Disp: 90 tablet, Rfl: 3   meclizine (ANTIVERT) 25 MG tablet, Take 1 tablet (25 mg total) by mouth 3 (three) times daily as needed for dizziness., Disp: 90 tablet, Rfl: 6   metFORMIN  (GLUCOPHAGE-XR) 750 MG 24 hr tablet, TAKE 2 TABLETS BY MOUTH ONCE DAILY WITH BREAKFAST, Disp: 180 tablet, Rfl: 3   mycophenolate (CELLCEPT) 500 MG tablet, Take 2 tablets (1,000 mg total) by mouth 2 (two) times daily., Disp: 360 tablet, Rfl: 5   pantoprazole (PROTONIX) 40 MG tablet, Take 1 tablet (40 mg total) by mouth daily., Disp: 90 tablet, Rfl: 3   rosuvastatin (CRESTOR) 10 MG tablet, Take 1 tablet (10 mg total) by mouth daily., Disp: 90 tablet, Rfl: 3   VASCEPA 1 g capsule, Take 2 capsules (2 g total) by mouth 2 (two) times daily., Disp: 120 capsule, Rfl: 12   zolpidem (AMBIEN) 5 MG tablet, Take 0.5-1 tablets (2.5-5 mg total) by mouth at bedtime as needed for sleep (put on file)., Disp: 15 tablet, Rfl: 2 Social History   Socioeconomic History   Marital status: Married    Spouse name: Not on file   Number of children: Not on file   Years of education: Not on file   Highest education level: 11th grade  Occupational History   Not on file  Tobacco Use   Smoking status: Every Day    Current packs/day: 1.00    Average packs/day: 1 pack/day for 40.0 years (40.0 ttl pk-yrs)    Types: Cigarettes   Smokeless tobacco: Never  Vaping Use   Vaping status: Never Used  Substance and Sexual Activity  Alcohol use: Yes    Alcohol/week: 30.0 standard drinks of alcohol    Types: 18 Cans of beer, 12 Shots of liquor per week    Comment: 3 beers per night   Drug use: Yes   Sexual activity: Not on file  Other Topics Concern   Not on file  Social History Narrative   Live at home with his wife   Right handed   Caffeine: 1 diet mtn dew daily, occasional tea (decaf), 1 cup of coffee in the mornings (no coffee when it's warm outside)   Social Determinants of Health   Financial Resource Strain: Not on file  Food Insecurity: Not on file  Transportation Needs: Not on file  Physical Activity: Not on file  Stress: Not on file  Social Connections: Not on file  Intimate Partner Violence: Unknown  (12/26/2021)   Received from [facility]   HITS    Physically Hurt: Not on file    Insult or Talk Down To: Not on file    Threaten Physical Harm: Not on file    Scream or Curse: Not on file   Family History  Problem Relation Age of Onset   Lung cancer Mother    Cancer Mother    Luiz Blare' disease Mother    Myasthenia gravis Neg Hx     Objective: Office vital signs reviewed. BP (!) 145/85   Pulse 68   Temp 98.7 F (37.1 C)   Ht 5\' 8"  (1.727 m)   Wt 202 lb 3.2 oz (91.7 kg)   SpO2 95%   BMI 30.74 kg/m   Physical Examination:  General: Awake, alert, well nourished, No acute distress HEENT: sclera white, MMM.  Sclera white.  PERRLA. Cardio: regular rate and rhythm, S1S2 heard, no murmurs appreciated Pulm: clear to auscultation bilaterally, no wheezes, rhonchi or rales; normal work of breathing on room air  Assessment/ Plan: 62 y.o. male   Diabetes mellitus treated with oral medication (HCC) - Plan: Bayer DCA Hb A1c Waived  Hyperlipidemia associated with type 2 diabetes mellitus (HCC)  Hypertension associated with diabetes (HCC) - Plan: losartan (COZAAR) 100 MG tablet  Aortic atherosclerosis (HCC)  Ocular myasthenia gravis (HCC)  Sugar remains under control.  No changes needed.  Continue cholesterol medication to progression of aortic atherosclerosis  Blood pressure not at goal.  Change ACE inhibitor to ARB.  Continue all other medications as prescribed.  Could consider combining HCTZ with ARB at some point if desires  Having some floaters.  Uncertain if this has anything to do with his myasthenia gravis but advised to follow-up with ophthalmology as directed  Raliegh Ip, DO [facility] [phone]

## 2023-11-03 ENCOUNTER — Encounter: Payer: Self-pay | Admitting: Family Medicine

## 2023-11-03 DIAGNOSIS — I152 Hypertension secondary to endocrine disorders: Secondary | ICD-10-CM

## 2023-11-03 MED ORDER — BENAZEPRIL HCL 40 MG PO TABS: 40.0000 mg | ORAL_TABLET | Freq: Every day | ORAL | 4 refills | Status: DC

## 2023-11-04 ENCOUNTER — Encounter: Payer: Self-pay | Admitting: Family Medicine

## 2023-11-23 ENCOUNTER — Institutional Professional Consult (permissible substitution) (HOSPITAL_BASED_OUTPATIENT_CLINIC_OR_DEPARTMENT_OTHER): Payer: BC Managed Care – PPO | Admitting: Internal Medicine

## 2023-12-07 ENCOUNTER — Telehealth: Payer: BC Managed Care – PPO | Admitting: Neurology

## 2023-12-10 ENCOUNTER — Telehealth: Payer: BC Managed Care – PPO | Admitting: Neurology

## 2023-12-10 DIAGNOSIS — G7000 Myasthenia gravis without (acute) exacerbation: Secondary | ICD-10-CM | POA: Diagnosis not present

## 2023-12-10 MED ORDER — MYCOPHENOLATE MOFETIL 500 MG PO TABS: 1000.0000 mg | ORAL_TABLET | Freq: Two times a day (BID) | ORAL | 5 refills | Status: DC

## 2023-12-10 NOTE — Progress Notes (Signed)
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 fluoroquinolones (ciprofloxacin, norfloxacin, ofloxacin, pefloxacin) Quinine, quinidine or procainamide Beta-blockers Calcium channel blockers Magnesium salts (milk of magnesia, some antacids, tocolytics) Botulinum toxin    Anson Fret, MD  Cc: Raliegh Ip, DO,  Naomie Dean, MD  [facility] [address] [place], [place] [address]  Phone [phone] Fax [phone]  I spent 20 minutes of face-to-face and non-face-to-face time with patient on the  1. ocular Myasthenia gravis     diagnosis.  This included previsit chart review, lab review, study review, order entry, electronic health record documentation, patient education on the different diagnostic and therapeutic options, counseling and coordination of care, risks and benefits of management, compliance, or risk factor reduction

## 2023-12-15 ENCOUNTER — Encounter: Payer: Self-pay | Admitting: Family Medicine

## 2023-12-15 ENCOUNTER — Ambulatory Visit: Payer: BC Managed Care – PPO | Admitting: Family Medicine

## 2023-12-15 VITALS — BP 151/84 | HR 63 | Temp 97.9°F | Ht 68.0 in | Wt 201.8 lb

## 2023-12-15 DIAGNOSIS — G7000 Myasthenia gravis without (acute) exacerbation: Secondary | ICD-10-CM

## 2023-12-15 DIAGNOSIS — K219 Gastro-esophageal reflux disease without esophagitis: Secondary | ICD-10-CM

## 2023-12-15 DIAGNOSIS — E119 Type 2 diabetes mellitus without complications: Secondary | ICD-10-CM | POA: Diagnosis not present

## 2023-12-15 DIAGNOSIS — F5101 Primary insomnia: Secondary | ICD-10-CM

## 2023-12-15 DIAGNOSIS — Z0001 Encounter for general adult medical examination with abnormal findings: Secondary | ICD-10-CM | POA: Diagnosis not present

## 2023-12-15 DIAGNOSIS — I152 Hypertension secondary to endocrine disorders: Secondary | ICD-10-CM | POA: Diagnosis not present

## 2023-12-15 DIAGNOSIS — E1169 Type 2 diabetes mellitus with other specified complication: Secondary | ICD-10-CM

## 2023-12-15 DIAGNOSIS — E785 Hyperlipidemia, unspecified: Secondary | ICD-10-CM

## 2023-12-15 DIAGNOSIS — F17210 Nicotine dependence, cigarettes, uncomplicated: Secondary | ICD-10-CM

## 2023-12-15 DIAGNOSIS — R3911 Hesitancy of micturition: Secondary | ICD-10-CM

## 2023-12-15 DIAGNOSIS — I700 Atherosclerosis of aorta: Secondary | ICD-10-CM

## 2023-12-15 DIAGNOSIS — E1159 Type 2 diabetes mellitus with other circulatory complications: Secondary | ICD-10-CM

## 2023-12-15 DIAGNOSIS — Z125 Encounter for screening for malignant neoplasm of prostate: Secondary | ICD-10-CM | POA: Diagnosis not present

## 2023-12-15 DIAGNOSIS — Z7984 Long term (current) use of oral hypoglycemic drugs: Secondary | ICD-10-CM | POA: Diagnosis not present

## 2023-12-15 DIAGNOSIS — Z0000 Encounter for general adult medical examination without abnormal findings: Secondary | ICD-10-CM

## 2023-12-15 LAB — LIPID PANEL

## 2023-12-15 LAB — BAYER DCA HB A1C WAIVED: HB A1C (BAYER DCA - WAIVED): 6.1 % — ABNORMAL HIGH (ref 4.8–5.6)

## 2023-12-15 MED ORDER — METFORMIN HCL ER 750 MG PO TB24: ORAL_TABLET | ORAL | 3 refills | Status: AC

## 2023-12-15 MED ORDER — DILTIAZEM HCL ER COATED BEADS 120 MG PO CP24: ORAL_CAPSULE | ORAL | 3 refills | Status: DC

## 2023-12-15 MED ORDER — HYDROCHLOROTHIAZIDE 25 MG PO TABS
25.0000 mg | ORAL_TABLET | Freq: Every day | ORAL | 3 refills | Status: AC
Start: 2023-12-15 — End: ?

## 2023-12-15 MED ORDER — ROSUVASTATIN CALCIUM 10 MG PO TABS: 10.0000 mg | ORAL_TABLET | Freq: Every day | ORAL | 3 refills | Status: AC

## 2023-12-15 MED ORDER — PANTOPRAZOLE SODIUM 40 MG PO TBEC
40.0000 mg | DELAYED_RELEASE_TABLET | Freq: Every day | ORAL | 3 refills | Status: AC
Start: 2023-12-15 — End: ?

## 2023-12-15 MED ORDER — VASCEPA 1 G PO CAPS: 2.0000 g | ORAL_CAPSULE | Freq: Two times a day (BID) | ORAL | 12 refills | Status: DC

## 2023-12-15 MED ORDER — AZELASTINE HCL 0.1 % NA SOLN: 1.0000 | Freq: Two times a day (BID) | NASAL | 12 refills | Status: AC

## 2023-12-15 NOTE — Progress Notes (Signed)
 Kenneth Fields is a 63 y.o. male presents to office today for annual physical exam examination.    Concerns today include: 1. Type 2 Diabetes with hypertension, hyperlipidemia:  Reports compliance with Metformin, Crestor and BP meds.  He reports no hypoglycemic episodes.  He has had a few blood sugars under 100 but nothing below 70.  He does not monitor blood pressures at home.  He does not add table salt to foods and most of his feeds are prepared by his wife.  He does eat out 1 day/week and continues to smoke 1 pack/day and has done so for at least 20 years with half pack per day for 25 years.  He denies any hemoptysis, unplanned weight loss, night sweats, change in exercise tolerance, difficulty swallowing or peripheral pain or neuropathy.  Last eye exam: Needs. Last foot exam: Up-to-date Last A1c:  Lab Results  Component Value Date   HGBA1C 6.5 (H) 08/17/2023   Nephropathy screen indicated?:  Needs Last flu, zoster and/or pneumovax:  Immunization History  Administered Date(s) Administered   Fluzone Influenza virus vaccine,trivalent (IIV3), split virus 06/09/2013   Influenza-Unspecified 06/22/2023   Janssen (J&J) SARS-COV-2 Vaccination 01/23/2020   Pneumococcal Polysaccharide-23 07/23/2014   Tdap 09/22/2009, 07/13/2010, 02/14/2013    ROS: As above   Marital status: Married, Substance use: Tobacco Health Maintenance Due  Topic Date Due   Diabetic kidney evaluation - Urine ACR  12/09/2023   Refills needed today: All  Immunization History  Administered Date(s) Administered   Fluzone Influenza virus vaccine,trivalent (IIV3), split virus 06/09/2013   Influenza-Unspecified 06/22/2023   Janssen (J&J) SARS-COV-2 Vaccination 01/23/2020   Pneumococcal Polysaccharide-23 07/23/2014   Tdap 09/22/2009, 07/13/2010, 02/14/2013   Past Medical History:  Diagnosis Date   COVID-19 08/2020   Diabetes (HCC)    High cholesterol    Hypertension    Myasthenia gravis (HCC)    Myasthenia  gravis (HCC)    Social History   Socioeconomic History   Marital status: Married    Spouse name: Not on file   Number of children: Not on file   Years of education: Not on file   Highest education level: 11th grade  Occupational History   Not on file  Tobacco Use   Smoking status: Every Day    Current packs/day: 1.00    Average packs/day: 1 pack/day for 40.0 years (40.0 ttl pk-yrs)    Types: Cigarettes   Smokeless tobacco: Never  Vaping Use   Vaping status: Never Used  Substance and Sexual Activity   Alcohol use: Yes    Alcohol/week: 30.0 standard drinks of alcohol    Types: 18 Cans of beer, 12 Shots of liquor per week    Comment: 3 beers per night   Drug use: Yes   Sexual activity: Not on file  Other Topics Concern   Not on file  Social History Narrative   Live at home with his wife   Right handed   Caffeine: 1 diet mtn dew daily, occasional tea (decaf), 1 cup of coffee in the mornings (no coffee when it's warm outside)   Social Drivers of Health   Financial Resource Strain: Low Risk  (12/15/2023)   Overall Financial Resource Strain (CARDIA)    Difficulty of Paying Living Expenses: Not hard at all  Food Insecurity: No Food Insecurity (12/15/2023)   Hunger Vital Sign    Worried About Running Out of Food in the Last Year: Never true    Ran Out of Food in the  Last Year: Never true  Transportation Needs: No Transportation Needs (12/15/2023)   PRAPARE - Administrator, Civil Service (Medical): No    Lack of Transportation (Non-Medical): No  Physical Activity: Sufficiently Active (12/15/2023)   Exercise Vital Sign    Days of Exercise per Week: 3 days    Minutes of Exercise per Session: 50 min  Stress: No Stress Concern Present (12/15/2023)   [employer] of Occupational Health - Occupational Stress Questionnaire    Feeling of Stress : Not at all  Social Connections: Moderately Integrated (12/15/2023)   Social Connection and Isolation Panel [NHANES]     Frequency of Communication with Friends and Family: Twice a week    Frequency of Social Gatherings with Friends and Family: Three times a week    Attends Religious Services: Never    Active Member of Clubs or Organizations: Yes    Attends Banker Meetings: More than 4 times per year    Marital Status: Married  Catering manager Violence: Not At Risk (12/15/2023)   Humiliation, Afraid, Rape, and Kick questionnaire    Fear of Current or Ex-Partner: No    Emotionally Abused: No    Physically Abused: No    Sexually Abused: No   Past Surgical History:  Procedure Laterality Date   ELBOW SURGERY Right    for tendonitis   TYMPANOSTOMY TUBE PLACEMENT Bilateral    as a child    WISDOM TOOTH EXTRACTION     Family History  Problem Relation Age of Onset   Lung cancer Mother    Cancer Mother    Luiz Blare' disease Mother    Myasthenia gravis Neg Hx     Current Outpatient Medications:    benazepril (LOTENSIN) 40 MG tablet, Take 1 tablet (40 mg total) by mouth daily., Disp: 90 tablet, Rfl: 4   cetirizine (ZYRTEC) 10 MG tablet, Take 10 mg by mouth daily as needed for allergies., Disp: , Rfl:    meclizine (ANTIVERT) 25 MG tablet, Take 1 tablet (25 mg total) by mouth 3 (three) times daily as needed for dizziness., Disp: 90 tablet, Rfl: 6   mycophenolate (CELLCEPT) 500 MG tablet, Take 2 tablets (1,000 mg total) by mouth 2 (two) times daily., Disp: 360 tablet, Rfl: 5   zolpidem (AMBIEN) 5 MG tablet, Take 0.5-1 tablets (2.5-5 mg total) by mouth at bedtime as needed for sleep (put on file)., Disp: 15 tablet, Rfl: 2   diltiazem (CARDIZEM CD) 120 MG 24 hr capsule, TAKE 1 CAPSULE BY MOUTH EVERY DAY, Disp: 90 capsule, Rfl: 3   hydrochlorothiazide (HYDRODIURIL) 25 MG tablet, Take 1 tablet (25 mg total) by mouth daily., Disp: 90 tablet, Rfl: 3   metFORMIN (GLUCOPHAGE-XR) 750 MG 24 hr tablet, TAKE 2 TABLETS BY MOUTH ONCE DAILY WITH BREAKFAST, Disp: 180 tablet, Rfl: 3   pantoprazole (PROTONIX) 40 MG  tablet, Take 1 tablet (40 mg total) by mouth daily., Disp: 90 tablet, Rfl: 3   rosuvastatin (CRESTOR) 10 MG tablet, Take 1 tablet (10 mg total) by mouth daily., Disp: 90 tablet, Rfl: 3   VASCEPA 1 g capsule, Take 2 capsules (2 g total) by mouth 2 (two) times daily., Disp: 120 capsule, Rfl: 12  Allergies  Allergen Reactions   Bee Venom Anaphylaxis   Chantix [Varenicline Tartrate]     "mental problems"   Losartan     Dizziness/ cough     ROS: Review of Systems A comprehensive review of systems was negative except for: Eyes: positive for floaters  Integument/breast: positive for skin lesion(s) Musculoskeletal: positive for arthralgias and stiff joints    Physical exam BP (!) 151/84   Pulse 63   Temp 97.9 F (36.6 C)   Ht 5\' 8"  (1.727 m)   Wt 201 lb 12.8 oz (91.5 kg)   SpO2 98%   BMI 30.68 kg/m  General appearance: alert, cooperative, appears stated age, and no distress Head: Normocephalic, without obvious abnormality, atraumatic Eyes: negative findings: lids and lashes normal, conjunctivae and sclerae normal, corneas clear, and pupils equal, round, reactive to light and accomodation Ears: normal TM's and external ear canals both ears Nose: Nares normal. Septum midline. Mucosa normal. No drainage or sinus tenderness. Throat:  Few missing teeth.  Oropharynx without exudates, masses.  No sublingual lesions Neck: no adenopathy, supple, symmetrical, trachea midline, and thyroid not enlarged, symmetric, no tenderness/mass/nodules Back: symmetric, no curvature. ROM normal. No CVA tenderness. Lungs: clear to auscultation bilaterally Chest wall: no tenderness Heart: regular rate and rhythm, S1, S2 normal, no murmur, click, rub or gallop Abdomen: soft, non-tender; bowel sounds normal; no masses,  no organomegaly Extremities: extremities normal, atraumatic, no cyanosis or edema Pulses: 2+ and symmetric Skin:  What appears to be skin tag along the left medial nipple.  He has a gumball  sized cyst underneath the right upper arm. Lymph nodes: Cervical, supraclavicular, and axillary nodes normal. Neurologic: Grossly normal      12/15/2023    8:00 AM 08/17/2023    3:20 PM 12/09/2022    8:03 AM  Depression screen PHQ 2/9  Decreased Interest 0 0 0  Down, Depressed, Hopeless 0 0 0  PHQ - 2 Score 0 0 0  Altered sleeping 0 0 0  Tired, decreased energy 0 0 0  Change in appetite 0 0 0  Feeling bad or failure about yourself  0 0 0  Trouble concentrating 0 0 0  Moving slowly or fidgety/restless 0 0 0  Suicidal thoughts 0 0 0  PHQ-9 Score 0 0 0  Difficult doing work/chores Not difficult at all Not difficult at all Not difficult at all      12/15/2023    8:01 AM 08/17/2023    3:21 PM 12/09/2022    8:03 AM 08/06/2022    3:57 PM  GAD 7 : Generalized Anxiety Score  Nervous, Anxious, on Edge 0 0 0 0  Control/stop worrying 0 0 0 0  Worry too much - different things 0 0 0 0  Trouble relaxing 0 0 0 0  Restless 0 0 0 0  Easily annoyed or irritable 0 0 0 0  Afraid - awful might happen 0 0 0 0  Total GAD 7 Score 0 0 0 0  Anxiety Difficulty Not difficult at all Not difficult at all  Not difficult at all     Assessment/ Plan: Lurlean Nanny here for annual physical exam.   Annual physical exam  Diabetes mellitus treated with oral medication (HCC) - Plan: Bayer DCA Hb A1c Waived, Microalbumin / creatinine urine ratio, metFORMIN (GLUCOPHAGE-XR) 750 MG 24 hr tablet  Hypertension associated with diabetes (HCC) - Plan: Microalbumin / creatinine urine ratio, diltiazem (CARDIZEM CD) 120 MG 24 hr capsule, hydrochlorothiazide (HYDRODIURIL) 25 MG tablet  Hyperlipidemia associated with type 2 diabetes mellitus (HCC) - Plan: Lipid Panel, CMP14+EGFR, rosuvastatin (CRESTOR) 10 MG tablet, VASCEPA 1 g capsule  Aortic atherosclerosis (HCC)  Ocular myasthenia gravis (HCC) - Plan: CBC  Heavy tobacco smoker >10 cigarettes per day - Plan: CBC, Ambulatory Referral Lung Cancer  Screening [place]  Pulmonary  Primary insomnia - Plan: ToxASSURE Select 13 (MW), Urine  Urinary hesitancy - Plan: PSA  Gastroesophageal reflux disease without esophagitis - Plan: pantoprazole (PROTONIX) 40 MG tablet  Urine microalbumin, A1c collected.  Sugar well-controlled.  No changes needed.  He will schedule diabetic eye exam.  He declined all vaccinations today.  He was amenable to getting lung cancer screening I placed order for this.  Blood pressure was not controlled despite triple therapy.  May need to consider advancing the Cardizem to 180 mg if blood pressure remains uncontrolled as the other 2 drugs are maxed out.  Encouraged salt reduction and repeat blood pressure in 2 weeks with nurse.  If persistently elevated will advance Cardizem  He will continue Vascepa, Crestor for prevention of progression of aortic atherosclerosis.  Fasting labs collected today  CBC collected given ongoing tobacco use.  Counseled impact of tobacco use on blood pressure  UDS updated for Ambien but he did not need refills on this so this was not sent in today.  Glad to renew if he requires prior to our 60-month follow-up  Expressed some urinary hesitancy and therefore will collect PSA.  Not severe symptoms so declined any medications for this at this time  GERD chronic and stable with PPI and that was renewed  Counseled on healthy lifestyle choices, including diet (rich in fruits, vegetables and lean meats and low in salt and simple carbohydrates) and exercise (at least 30 minutes of moderate physical activity daily).  Patient to follow up 53m for DM  Luba Matzen M. Nadine Counts, DO

## 2023-12-15 NOTE — Addendum Note (Signed)
 Addended by: Raliegh Ip on: 12/15/2023 09:06 AM   Modules accepted: Orders

## 2023-12-15 NOTE — Patient Instructions (Signed)
 Lung Cancer Screening A lung cancer screening is a test that checks for lung cancer when there are no symptoms or history of that disease. The screening is done to look for lung cancer in its very early stages. Finding cancer early improves the chances of successful treatment. It may save your life. Who should have a screening? You should be screened for lung cancer if all of these apply: You currently smoke or you used to smoke. You are between the ages of 51 and 31 years old. Screening may be recommended up to age 58 depending on your overall health and other factors. You have a smoking history of 1 pack of cigarettes a day for 20 years or 2 packs a day for 10 years. How is screening done?  The recommended screening test is a low-dose computed tomography (LDCT) scan. This scan takes detailed images of the lungs. This allows a health care provider to look for abnormal cells. If you are at risk for lung cancer, it is recommended that you get screened once a year. Talk to your health care provider about the risks, benefits, and limitations of screening. What are the benefits of screening? Screening can find lung cancer early, before symptoms start and before it has spread outside of the lungs. The chances of curing lung cancer are greater if the cancer is diagnosed early. What are the risks of screening? The screening may show lung cancer when no cancer is present. Talk with your health care provider about what your results mean. In some cases, your health care provider may do more testing to confirm the results. The screening may not find lung cancer when it is present. You will be exposed to radiation from repeated LDCT tests, which can cause cancer in otherwise healthy people. How can I lower my risk of lung cancer? Make these lifestyle changes to lower your risk of developing lung cancer: Do not use any products that contain nicotine or tobacco. These products include cigarettes, chewing  tobacco, and vaping devices, such as e-cigarettes. If you need help quitting, ask your health care provider. Avoid secondhand smoke. Avoid exposure to radiation. Avoid exposure to radon gas. Have your home checked for radon regularly. Avoid things that cause cancer (carcinogens). Avoid living or working in places with high air pollution or diesel exhaust. Questions to ask your health care provider Am I eligible for lung cancer screening? Does my health insurance cover the cost of lung cancer screening? What happens if the lung cancer screening shows something of concern? How soon will I have results from my lung cancer screening? Is there anything that I need to do to prepare for my lung cancer screening? What happens if I decide not to have lung cancer screening? Where to find more information Ask your health care provider about the risks and benefits of screening. More information and resources are available from these organizations: American Cancer Society (ACS): cancer.org American Lung Association: lung.org National Cancer Institute: cancer.gov Contact a health care provider if: You start to show symptoms of lung cancer, including: A cough that will not go away. High-pitched whistling sounds when you breathe, most often when you breathe out (wheezing). Chest pain. Coughing up blood. Shortness of breath. Weight loss that cannot be explained. Constant tiredness (fatigue). Hoarse voice. Summary Lung cancer screening may find lung cancer before symptoms appear. Finding cancer early improves the chances of successful treatment. It may save your life. The recommended screening test is a low-dose computed tomography (LDCT) scan that  looks for abnormal cells in the lungs. If you are at risk for lung cancer, it is recommended that you get screened once a year. You can make lifestyle changes to lower your risk of lung cancer. Ask your health care provider about the risks and benefits of  screening. This information is not intended to replace advice given to you by your health care provider. Make sure you discuss any questions you have with your health care provider. Document Revised: 09/16/2022 Document Reviewed: 02/27/2021 Elsevier Patient Education  2024 [employer].

## 2023-12-16 ENCOUNTER — Encounter: Payer: Self-pay | Admitting: Family Medicine

## 2023-12-16 DIAGNOSIS — E1169 Type 2 diabetes mellitus with other specified complication: Secondary | ICD-10-CM

## 2023-12-16 DIAGNOSIS — I700 Atherosclerosis of aorta: Secondary | ICD-10-CM

## 2023-12-16 LAB — CBC
Hematocrit: 47.6 % (ref 37.5–51.0)
Hemoglobin: 16.6 g/dL (ref 13.0–17.7)
MCH: 32.7 pg (ref 26.6–33.0)
MCHC: 34.9 g/dL (ref 31.5–35.7)
MCV: 94 fL (ref 79–97)
Platelets: 269 10*3/uL (ref 150–450)
RBC: 5.07 x10E6/uL (ref 4.14–5.80)
RDW: 12.8 % (ref 11.6–15.4)
WBC: 7.4 10*3/uL (ref 3.4–10.8)

## 2023-12-16 LAB — CMP14+EGFR
ALT: 25 IU/L (ref 0–44)
AST: 22 IU/L (ref 0–40)
Albumin: 4.5 g/dL (ref 3.9–4.9)
Alkaline Phosphatase: 76 IU/L (ref 44–121)
BUN/Creatinine Ratio: 22 (ref 10–24)
BUN: 17 mg/dL (ref 8–27)
Bilirubin Total: 0.4 mg/dL (ref 0.0–1.2)
CO2: 18 mmol/L — ABNORMAL LOW (ref 20–29)
Calcium: 9.7 mg/dL (ref 8.6–10.2)
Chloride: 104 mmol/L (ref 96–106)
Creatinine, Ser: 0.78 mg/dL (ref 0.76–1.27)
Globulin, Total: 2.1 g/dL (ref 1.5–4.5)
Glucose: 122 mg/dL — ABNORMAL HIGH (ref 70–99)
Potassium: 4.4 mmol/L (ref 3.5–5.2)
Sodium: 139 mmol/L (ref 134–144)
Total Protein: 6.6 g/dL (ref 6.0–8.5)
eGFR: 101 mL/min/{1.73_m2} (ref >59)

## 2023-12-16 LAB — LIPID PANEL
Cholesterol, Total: 260 mg/dL — ABNORMAL HIGH (ref 100–199)
HDL: 40 mg/dL (ref >39)
LDL CALC COMMENT:: 6.5 ratio — ABNORMAL HIGH (ref 0.0–5.0)
LDL Chol Calc (NIH): 139 mg/dL — ABNORMAL HIGH (ref 0–99)
Triglycerides: 439 mg/dL — ABNORMAL HIGH (ref 0–149)
VLDL Cholesterol Cal: 81 mg/dL — ABNORMAL HIGH (ref 5–40)

## 2023-12-16 LAB — PSA: Prostate Specific Ag, Serum: 0.8 ng/mL (ref 0.0–4.0)

## 2023-12-17 LAB — MICROALBUMIN / CREATININE URINE RATIO
Creatinine, Urine: 100.7 mg/dL
Microalb/Creat Ratio: 4 mg/g{creat} (ref 0–29)
Microalbumin, Urine: 4 ug/mL

## 2023-12-17 LAB — TOXASSURE SELECT 13 (MW), URINE

## 2023-12-29 ENCOUNTER — Ambulatory Visit (INDEPENDENT_AMBULATORY_CARE_PROVIDER_SITE_OTHER)

## 2023-12-29 VITALS — BP 137/71 | HR 62 | Temp 97.9°F

## 2023-12-29 DIAGNOSIS — I10 Essential (primary) hypertension: Secondary | ICD-10-CM

## 2023-12-29 NOTE — Progress Notes (Signed)
 Patient is in office today for a nurse visit for Blood Pressure Check. Patient blood pressure was 150/76, Patient No chest pain. Recheck blood pressure was 137/71

## 2024-02-04 DIAGNOSIS — H43391 Other vitreous opacities, right eye: Secondary | ICD-10-CM | POA: Diagnosis not present

## 2024-02-04 LAB — HM DIABETES EYE EXAM

## 2024-05-09 ENCOUNTER — Telehealth (HOSPITAL_BASED_OUTPATIENT_CLINIC_OR_DEPARTMENT_OTHER): Payer: Self-pay | Admitting: *Deleted

## 2024-05-09 ENCOUNTER — Other Ambulatory Visit (HOSPITAL_COMMUNITY): Payer: Self-pay

## 2024-05-09 ENCOUNTER — Telehealth: Payer: Self-pay | Admitting: Pharmacy Technician

## 2024-05-09 ENCOUNTER — Encounter (HOSPITAL_BASED_OUTPATIENT_CLINIC_OR_DEPARTMENT_OTHER): Payer: Self-pay | Admitting: Internal Medicine

## 2024-05-09 ENCOUNTER — Ambulatory Visit (HOSPITAL_BASED_OUTPATIENT_CLINIC_OR_DEPARTMENT_OTHER): Admitting: Internal Medicine

## 2024-05-09 VITALS — BP 152/98 | HR 55 | Ht 68.0 in | Wt 199.8 lb

## 2024-05-09 DIAGNOSIS — I2510 Atherosclerotic heart disease of native coronary artery without angina pectoris: Secondary | ICD-10-CM

## 2024-05-09 DIAGNOSIS — E782 Mixed hyperlipidemia: Secondary | ICD-10-CM

## 2024-05-09 DIAGNOSIS — E785 Hyperlipidemia, unspecified: Secondary | ICD-10-CM | POA: Diagnosis not present

## 2024-05-09 DIAGNOSIS — Z79899 Other long term (current) drug therapy: Secondary | ICD-10-CM | POA: Diagnosis not present

## 2024-05-09 DIAGNOSIS — I10 Essential (primary) hypertension: Secondary | ICD-10-CM

## 2024-05-09 MED ORDER — REPATHA SURECLICK 140 MG/ML ~~LOC~~ SOAJ: 140.0000 mg | SUBCUTANEOUS | 6 refills | Status: DC

## 2024-05-09 NOTE — Progress Notes (Unsigned)
 LIPID CLINIC CONSULT NOTE  Chief Complaint:  Dyslipidemia  Primary Care Physician: Kenneth Norene HERO, DO  Primary Cardiologist:  None  HPI:  Kenneth Fields is a 63 y.o. male who is being seen today for the evaluation of lipidemia at the request of Kenneth Norene HERO, DO.  This is a 63 year old male kindly referred for evaluation management of dyslipidemia.  He has a history of coronary artery calcification with left main and three-vessel coronary artery calcium  noted in the past.  He has been able to tolerate 10 mg rosuvastatin  but was also prescribed Vascepa  however the cost was too expensive.  He is taking over-the-counter fish oil.  His last lipid profile in March 2025 showed total cholesterol 260, HDL 40, triglycerides 439 and LDL 139.  PMHx:  Past Medical History:  Diagnosis Date   COVID-19 08/2020   Diabetes (HCC)    High cholesterol    Hypertension    Myasthenia gravis (HCC)    Myasthenia gravis (HCC)     Past Surgical History:  Procedure Laterality Date   ELBOW SURGERY Right    for tendonitis   TYMPANOSTOMY TUBE PLACEMENT Bilateral    as a child    WISDOM TOOTH EXTRACTION      FAMHx:  Family History  Problem Relation Age of Onset   Lung cancer Mother    Cancer Mother    Yvone' disease Mother    Myasthenia gravis Neg Hx     SOCHx:   reports that he has been smoking cigarettes. He has a 40 pack-year smoking history. He has never used smokeless tobacco. He reports current alcohol use of about 30.0 standard drinks of alcohol per week. He reports current drug use.  ALLERGIES:  Allergies  Allergen Reactions   Bee Venom Anaphylaxis   Chantix [Varenicline Tartrate]     mental problems   Losartan      Dizziness/ cough    ROS: Pertinent items noted in HPI and remainder of comprehensive ROS otherwise negative.  HOME MEDS: Current Outpatient Medications on File Prior to Visit  Medication Sig Dispense Refill   azelastine  (ASTELIN ) 0.1 % nasal spray  Place 1 spray into both nostrils 2 (two) times daily. 30 mL 12   benazepril  (LOTENSIN ) 40 MG tablet Take 1 tablet (40 mg total) by mouth daily. 90 tablet 4   cetirizine (ZYRTEC) 10 MG tablet Take 10 mg by mouth daily as needed for allergies.     diltiazem  (CARDIZEM  CD) 120 MG 24 hr capsule TAKE 1 CAPSULE BY MOUTH EVERY DAY 90 capsule 3   hydrochlorothiazide  (HYDRODIURIL ) 25 MG tablet Take 1 tablet (25 mg total) by mouth daily. 90 tablet 3   meclizine  (ANTIVERT ) 25 MG tablet Take 1 tablet (25 mg total) by mouth 3 (three) times daily as needed for dizziness. 90 tablet 6   metFORMIN  (GLUCOPHAGE -XR) 750 MG 24 hr tablet TAKE 2 TABLETS BY MOUTH ONCE DAILY WITH BREAKFAST 180 tablet 3   mycophenolate  (CELLCEPT ) 500 MG tablet Take 2 tablets (1,000 mg total) by mouth 2 (two) times daily. 360 tablet 5   Omega-3 1000 MG CAPS Take 1 g by mouth.     pantoprazole  (PROTONIX ) 40 MG tablet Take 1 tablet (40 mg total) by mouth daily. 90 tablet 3   rosuvastatin  (CRESTOR ) 10 MG tablet Take 1 tablet (10 mg total) by mouth daily. 90 tablet 3   zolpidem  (AMBIEN ) 5 MG tablet Take 0.5-1 tablets (2.5-5 mg total) by mouth at bedtime as needed for sleep (put  on file). 15 tablet 2   VASCEPA  1 g capsule Take 2 capsules (2 g total) by mouth 2 (two) times daily. 120 capsule 12   No current facility-administered medications on file prior to visit.    LABS/IMAGING: No results found for this or any previous visit (from the past 48 hours). No results found.  LIPID PANEL:    Component Value Date/Time   CHOL 260 (H) 12/15/2023 0809   TRIG 439 (H) 12/15/2023 0809   HDL 40 12/15/2023 0809   CHOLHDL 6.5 (H) 12/15/2023 0809   LDLCALC 139 (H) 12/15/2023 0809   LDLDIRECT 143 (H) 04/10/2023 1522    No results found for: LIPOA   WEIGHTS: Wt Readings from Last 3 Encounters:  05/09/24 199 lb 12.8 oz (90.6 kg)  12/15/23 201 lb 12.8 oz (91.5 kg)  08/17/23 202 lb 3.2 oz (91.7 kg)    VITALS: BP (!) 152/98   Pulse (!) 55    Ht 5' 8 (1.727 m)   Wt 199 lb 12.8 oz (90.6 kg)   SpO2 97%   BMI 30.38 kg/m   EXAM: Deferred  EKG: Deferred  ASSESSMENT: Multivessel coronary artery calcification seen on chest CT (2020) Dyslipidemia, goal LDL less than 70 History of high triglycerides Hypertension  PLAN: 1.   Kenneth Fields has a history of multivessel coronary calcification seen by chest CT in 2020.  His target LDL is less than 70 but he has persistently elevated LDL cholesterol and triglycerides despite being on statin therapy and fish oil.  He could not afford the cost of Vascepa  which would be arguably a superior product to over-the-counter fish oil.  I think he would benefit greatly from additional lipid-lowering and is a good candidate for PCSK9 inhibitor in addition to his rosuvastatin .  I would advise starting Repatha  and will reach out for prior authorization.  Plan repeat lipids including NMR and LP(a) in about 3 to 4 months.  Thanks again for the kind referral.  Kenneth KYM Maxcy, MD, [facility], FNLA, FACP  [place]  [facility]  Medical Director of the Advanced Lipid Disorders &  Cardiovascular Risk Reduction Clinic Diplomate of the American Board of Clinical Lipidology Attending Cardiologist  Direct Dial: [phone]  Fax: [phone]  Website:  [email or web address] Kenneth Fields Kenneth Fields 05/09/2024, 9:04 AM

## 2024-05-09 NOTE — Telephone Encounter (Signed)
 Spoke with the pt and he agreed to $50 a month for his repatha   Repatha  refill sent to his pharmacy of choice.

## 2024-05-09 NOTE — Telephone Encounter (Signed)
 Pharmacy Patient Advocate Encounter   Received notification from Physician's Office that prior authorization for Repatha  is required/requested.   Insurance verification completed.   The patient is insured through [facility] ADVANTAGE/RX ADVANCE .   Per test claim: The current 05/09/24 day co-pay is, $50.00- one month.  No PA needed at this time. This test claim was processed through [facility]- copay amounts may vary at other pharmacies due to pharmacy/plan contracts, or as the patient moves through the different stages of their insurance plan.

## 2024-05-09 NOTE — Patient Instructions (Addendum)
 Medication Instructions:   VASCEPA  HAS BEEN DISCONTINUED FROM YOUR MEDICATION LIST  PLEASE CONTINUE YOUR ROSUVASTATIN  (CRESTOR ) 10 MG BY MOUTH DAILY   Dr. Mona recommends REPATHA  (PCSK9). This is an injectable cholesterol medication self-administered  This medication will likely need prior approval with your insurance company, which we will work on. If the medication is not approved initially, we may need to do an appeal with your insurance.   Administer medication in area of fatty tissue such as abdomen, outer thigh, back of upper arm - and rotate site with each injection Store medication in refrigerator until ready to administer - allow to sit at room temp for 30 mins - 1 hour prior to injection Dispose of medication in a SHARPS container - your pharmacy should be able to direct you on this and proper disposal   If you need a co-pay card for Repatha : https://www.repatha .com/repatha -cost If you need a co-pay card for Praluent: [email or web address]://[email or web address]  Patient Assistance:    These foundations have funds at various times.   The [employer] Foundation: https://www.panfoundation.org/disease-funds/hypercholesterolemia/ -- can sign up for wait list  The [facility] offers assistance to help pay for medication copays.  They will cover copays for all cholesterol lowering meds, including statins, fibrates, omega-3 fish oils like Vascepa , ezetimibe, Repatha , Praluent, Nexletol, Nexlizet.  The cards are usually good for $2,500 or 12 months, whichever comes first. Our fax # is [phone] (you will need this to apply) Go to healthwellfoundation.org Click on "Apply Now" Answer questions as to whom is applying (patient or representative) Your disease fund will be "hypercholesterolemia - Medicare access" They will ask questions about finances and which medications you are taking for cholesterol When you submit, the approval is usually within minutes.  You will need to print the  card information from the site You will need to show this information to your pharmacy, they will bill your Medicare Part D plan first -then bill Health Well --for the copay.   You can also call them at [phone], although the hold times can be quite long.     *If you need a refill on your cardiac medications before your next appointment, please call your pharmacy*   Lab Work:  IN 3 MONTHS HERE AT [facility] ON THE 3RD FLOOR ([address])--NMR LIPOPROFILE AND LIPOPROTEIN A--PLEASE COME FASTING TO THIS LAB APPOINTMENT  If you have labs (blood work) drawn today and your tests are completely normal, you will receive your results only by: MyChart Message (if you have MyChart) OR A paper copy in the mail If you have any lab test that is abnormal or we need to change your treatment, we will call you to review the results.    Follow-Up:  AS NEEDED

## 2024-05-09 NOTE — Telephone Encounter (Signed)
 RE: REPATHA  PA PER DR HILTY Received: Today Billy Camelia BIRCH, CPhT  Gladis Porter HERO, LPN Replies will be sent to P Rx Prior Auth Team No PA needed at this time. Test claim: $50.00 for one month       Previous Messages    ----- Message ----- From: Gladis Porter HERO, LPN Sent: 1/81/7974   9:39 AM EDT To: Andriette HERO Eke, RN; Cv Div Pharmd; Rx Prior * Subject: REPATHA  PA PER DR HILTY                        Dr. Mona saw this pt in as a new consult in lipid clinic today and wants to start him on Repatha   Can you start the PA process for this and let us  know?  Thanks, [employer]

## 2024-06-20 ENCOUNTER — Ambulatory Visit: Payer: Self-pay | Admitting: Family Medicine

## 2024-06-20 ENCOUNTER — Ambulatory Visit: Admitting: Family Medicine

## 2024-06-20 ENCOUNTER — Encounter: Payer: Self-pay | Admitting: Family Medicine

## 2024-06-20 VITALS — BP 164/81 | HR 60 | Temp 98.7°F | Ht 69.0 in | Wt 189.0 lb

## 2024-06-20 DIAGNOSIS — N528 Other male erectile dysfunction: Secondary | ICD-10-CM | POA: Diagnosis not present

## 2024-06-20 DIAGNOSIS — E119 Type 2 diabetes mellitus without complications: Secondary | ICD-10-CM | POA: Diagnosis not present

## 2024-06-20 DIAGNOSIS — E1169 Type 2 diabetes mellitus with other specified complication: Secondary | ICD-10-CM

## 2024-06-20 DIAGNOSIS — E1159 Type 2 diabetes mellitus with other circulatory complications: Secondary | ICD-10-CM

## 2024-06-20 DIAGNOSIS — F5101 Primary insomnia: Secondary | ICD-10-CM

## 2024-06-20 DIAGNOSIS — Z7984 Long term (current) use of oral hypoglycemic drugs: Secondary | ICD-10-CM | POA: Diagnosis not present

## 2024-06-20 DIAGNOSIS — E785 Hyperlipidemia, unspecified: Secondary | ICD-10-CM

## 2024-06-20 DIAGNOSIS — I152 Hypertension secondary to endocrine disorders: Secondary | ICD-10-CM

## 2024-06-20 LAB — BAYER DCA HB A1C WAIVED: HB A1C (BAYER DCA - WAIVED): 6 % — ABNORMAL HIGH (ref 4.8–5.6)

## 2024-06-20 MED ORDER — ZOLPIDEM TARTRATE ER 6.25 MG PO TBCR: 6.2500 mg | EXTENDED_RELEASE_TABLET | Freq: Every evening | ORAL | 0 refills | Status: AC | PRN

## 2024-06-20 MED ORDER — SILDENAFIL CITRATE 100 MG PO TABS: 50.0000 mg | ORAL_TABLET | Freq: Every day | ORAL | 11 refills | Status: AC | PRN

## 2024-06-20 MED ORDER — DILTIAZEM HCL ER COATED BEADS 180 MG PO CP24: ORAL_CAPSULE | ORAL | 3 refills | Status: DC

## 2024-06-20 NOTE — Progress Notes (Signed)
 Subjective: CC:DM PCP: Jolinda Norene HERO, DO YEP:Kenneth Fields is a 63 y.o. male presenting to clinic today for:  Type 2 Diabetes with hypertension, hyperlipidemia:  He reports compliance with his metformin , rosuvastatin , Repatha , hydrochlorothiazide  and Cardizem .  Also on benazepril .  Saw cardiology and was started on Repatha  in the last couple of months and so far is tolerating that without any difficulty.  He continues to stay very physically active, walking over 16,000 steps at work and watches his diet.  Denies any salt intake. No chest pain, shortness of breath, edema.  No reports of change in exercise tolerance   Diabetes Health Maintenance Due  Topic Date Due   FOOT EXAM  04/09/2024   HEMOGLOBIN A1C  06/16/2024   OPHTHALMOLOGY EXAM  02/03/2025    Erectile dysfunction Patient would like to start back on Viagra.  Has taken this previously and had no complications with the medicine.  Insomnia Patient reports the but the Ambien  does work to some degree he does not stay asleep.  He uses very sparingly and typically only on a weekend when he is not working.  Last refill 2023.  At best only getting 7 hours of sleep   ROS: Per HPI  Allergies  Allergen Reactions   Bee Venom Anaphylaxis   Chantix [Varenicline Tartrate]     mental problems   Losartan      Dizziness/ cough   Past Medical History:  Diagnosis Date   COVID-19 08/2020   Diabetes (HCC)    High cholesterol    Hypertension    Myasthenia gravis (HCC)    Myasthenia gravis (HCC)     Current Outpatient Medications:    azelastine  (ASTELIN ) 0.1 % nasal spray, Place 1 spray into both nostrils 2 (two) times daily., Disp: 30 mL, Rfl: 12   benazepril  (LOTENSIN ) 40 MG tablet, Take 1 tablet (40 mg total) by mouth daily., Disp: 90 tablet, Rfl: 4   cetirizine (ZYRTEC) 10 MG tablet, Take 10 mg by mouth daily as needed for allergies., Disp: , Rfl:    diltiazem  (CARDIZEM  CD) 120 MG 24 hr capsule, TAKE 1 CAPSULE BY MOUTH  EVERY DAY, Disp: 90 capsule, Rfl: 3   Evolocumab  (REPATHA  SURECLICK) 140 MG/ML SOAJ, Inject 140 mg into the skin every 14 (fourteen) days., Disp: 2 mL, Rfl: 6   hydrochlorothiazide  (HYDRODIURIL ) 25 MG tablet, Take 1 tablet (25 mg total) by mouth daily., Disp: 90 tablet, Rfl: 3   meclizine  (ANTIVERT ) 25 MG tablet, Take 1 tablet (25 mg total) by mouth 3 (three) times daily as needed for dizziness., Disp: 90 tablet, Rfl: 6   metFORMIN  (GLUCOPHAGE -XR) 750 MG 24 hr tablet, TAKE 2 TABLETS BY MOUTH ONCE DAILY WITH BREAKFAST, Disp: 180 tablet, Rfl: 3   mycophenolate  (CELLCEPT ) 500 MG tablet, Take 2 tablets (1,000 mg total) by mouth 2 (two) times daily., Disp: 360 tablet, Rfl: 5   Omega-3 1000 MG CAPS, Take 1 g by mouth., Disp: , Rfl:    pantoprazole  (PROTONIX ) 40 MG tablet, Take 1 tablet (40 mg total) by mouth daily., Disp: 90 tablet, Rfl: 3   rosuvastatin  (CRESTOR ) 10 MG tablet, Take 1 tablet (10 mg total) by mouth daily., Disp: 90 tablet, Rfl: 3   zolpidem  (AMBIEN ) 5 MG tablet, Take 0.5-1 tablets (2.5-5 mg total) by mouth at bedtime as needed for sleep (put on file)., Disp: 15 tablet, Rfl: 2 Social History   Socioeconomic History   Marital status: Married    Spouse name: Not on file   Number  of children: Not on file   Years of education: Not on file   Highest education level: 11th grade  Occupational History   Not on file  Tobacco Use   Smoking status: Every Day    Current packs/day: 1.00    Average packs/day: 1 pack/day for 40.0 years (40.0 ttl pk-yrs)    Types: Cigarettes   Smokeless tobacco: Never   Tobacco comments:    Smoking 1 PPD, does not want to quit  Vaping Use   Vaping status: Never Used  Substance and Sexual Activity   Alcohol use: Yes    Alcohol/week: 30.0 standard drinks of alcohol    Types: 18 Cans of beer, 12 Shots of liquor per week    Comment: 3 beers per night   Drug use: Yes   Sexual activity: Not on file  Other Topics Concern   Not on file  Social History  Narrative   Live at home with his wife   Right handed   Caffeine: 1 diet mtn dew daily, occasional tea (decaf), 1 cup of coffee in the mornings (no coffee when it's warm outside)   Social Drivers of Health   Financial Resource Strain: Low Risk  (12/15/2023)   Overall Financial Resource Strain (CARDIA)    Difficulty of Paying Living Expenses: Not hard at all  Food Insecurity: No Food Insecurity (12/15/2023)   Hunger Vital Sign    Worried About Running Out of Food in the Last Year: Never true    Ran Out of Food in the Last Year: Never true  Transportation Needs: No Transportation Needs (12/15/2023)   PRAPARE - Administrator, Civil Service (Medical): No    Lack of Transportation (Non-Medical): No  Physical Activity: Sufficiently Active (12/15/2023)   Exercise Vital Sign    Days of Exercise per Week: 3 days    Minutes of Exercise per Session: 50 min  Stress: No Stress Concern Present (12/15/2023)   [employer] of Occupational Health - Occupational Stress Questionnaire    Feeling of Stress : Not at all  Social Connections: Moderately Integrated (12/15/2023)   Social Connection and Isolation Panel    Frequency of Communication with Friends and Family: Twice a week    Frequency of Social Gatherings with Friends and Family: Three times a week    Attends Religious Services: Never    Active Member of Clubs or Organizations: Yes    Attends Banker Meetings: More than 4 times per year    Marital Status: Married  Catering manager Violence: Not At Risk (12/15/2023)   Humiliation, Afraid, Rape, and Kick questionnaire    Fear of Current or Ex-Partner: No    Emotionally Abused: No    Physically Abused: No    Sexually Abused: No   Family History  Problem Relation Age of Onset   Lung cancer Mother    Cancer Mother    Yvone' disease Mother    Myasthenia gravis Neg Hx     Objective: Office vital signs reviewed. BP (!) 164/81   Pulse 60   Temp 98.7 F (37.1 C)    Ht 5' 9 (1.753 m)   Wt 189 lb (85.7 kg)   SpO2 98%   BMI 27.91 kg/m   Physical Examination:  General: Awake, alert, well nourished, No acute distress HEENT: Sclera white.  Moist mucous membranes Cardio: regular rate and rhythm, S1S2 heard, no murmurs appreciated Pulm: clear to auscultation bilaterally, no wheezes, rhonchi or rales; normal work of breathing on  room air Psych: Mood stable, speech normal, affect appropriate   Lab Results  Component Value Date   HGBA1C 6.1 (H) 12/15/2023    Assessment/ Plan: 63 y.o. male   Diabetes mellitus treated with oral medication (HCC) - Plan: Bayer DCA Hb A1c Waived  Hypertension associated with diabetes (HCC) - Plan: diltiazem  (CARDIZEM  CD) 180 MG 24 hr capsule  Hyperlipidemia associated with type 2 diabetes mellitus (HCC)  Other male erectile dysfunction - Plan: sildenafil (VIAGRA) 100 MG tablet  Primary insomnia - Plan: zolpidem  (AMBIEN  CR) 6.25 MG CR tablet  Sugar under excellent control A1c at 6.0.  No changes.  Diabetic foot exam next visit.  Declined all vaccinations today  Blood pressure is not well-controlled.  I advanced his Cardizem  to 180 mg daily.  Not due for fasting lipid but seems to be tolerating Repatha  without difficulty  Viagra sent.  Discussed red flag signs and symptoms and reasons for discontinuation  Trial of controlled release Ambien .  He uses this very sparingly with last Ambien  renewal in 2023.  Up-to-date on UDS and CSC and the national narcotic database was reviewed with no red flags   Norene CHRISTELLA Fielding, DO [facility] [phone]
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
## 2024-07-27 ENCOUNTER — Ambulatory Visit (HOSPITAL_BASED_OUTPATIENT_CLINIC_OR_DEPARTMENT_OTHER): Admitting: Nurse Practitioner

## 2024-07-27 ENCOUNTER — Encounter (HOSPITAL_BASED_OUTPATIENT_CLINIC_OR_DEPARTMENT_OTHER): Payer: Self-pay | Admitting: Nurse Practitioner

## 2024-07-27 VITALS — BP 150/80 | HR 57 | Ht 69.0 in | Wt 197.6 lb

## 2024-07-27 DIAGNOSIS — I2510 Atherosclerotic heart disease of native coronary artery without angina pectoris: Secondary | ICD-10-CM | POA: Diagnosis not present

## 2024-07-27 DIAGNOSIS — E785 Hyperlipidemia, unspecified: Secondary | ICD-10-CM

## 2024-07-27 DIAGNOSIS — I10 Essential (primary) hypertension: Secondary | ICD-10-CM | POA: Diagnosis not present

## 2024-07-27 DIAGNOSIS — Z7189 Other specified counseling: Secondary | ICD-10-CM | POA: Diagnosis not present

## 2024-07-27 MED ORDER — REPATHA SURECLICK 140 MG/ML ~~LOC~~ SOAJ: 140.0000 mg | SUBCUTANEOUS | 3 refills | Status: AC

## 2024-07-27 MED ORDER — ASPIRIN 81 MG PO TBEC: 81.0000 mg | DELAYED_RELEASE_TABLET | Freq: Every day | ORAL | Status: AC

## 2024-07-27 NOTE — Patient Instructions (Signed)
 Medication Instructions:   START Aspirin EC one (1) tablet by mouth ( 81 mg) daily.  *If you need a refill on your cardiac medications before your next appointment, please call your pharmacy*  Lab Work: Your physician recommends that you return for lab work today.   NMR Lipoprofile and Lp(a)  If you have labs (blood work) drawn today and your tests are completely normal, you will receive your results only by: MyChart Message (if you have MyChart) OR A paper copy in the mail If you have any lab test that is abnormal or we need to change your treatment, we will call you to review the results.  Testing/Procedures:  None ordered.   Follow-Up: At [facility], you and your health needs are our priority.  As part of our continuing mission to provide you with exceptional heart care, our providers are all part of one team.  This team includes your primary Cardiologist (physician) and Advanced Practice Providers or APPs (Physician Assistants and Nurse Practitioners) who all work together to provide you with the care you need, when you need it.  Your next appointment:   1 year(s)  Provider:   Rosaline Bane, NP in Lipid Clinic  We recommend signing up for the patient portal called MyChart.  Sign up information is provided on this After Visit Summary.  MyChart is used to connect with patients for Virtual Visits (Telemedicine).  Patients are able to view lab/test results, encounter notes, upcoming appointments, etc.  Non-urgent messages can be sent to your provider as well.   To learn more about what you can do with MyChart, go to [email or web address].   Other Instructions  Your physician wants you to follow-up in: 1 year. You will receive a reminder letter in the mail two months in advance. If you don't receive a letter, please call our office to schedule the follow-up appointment.      HOW TO TAKE YOUR BLOOD PRESSURE  Rest 5 minutes before taking your blood  pressure. Don't  smoke or drink caffeinated beverages for at least 30 minutes before. Take your blood pressure before (not after) you eat. Sit comfortably with your back supported and both feet on the floor ( don't cross your legs). Elevate your arm to heart level on a table or a desk. Use the proper sized cuff.  It should fit smoothly and snugly around your bare upper arm.  There should be  Enough room to slip a fingertip under the cuff.  The bottom edge of the cuff should be 1 inch above the crease Of the elbow. Please monitor your blood pressure once daily 2 hours after your am medication. Your goal blood pressure 140 (systolic) top number or over 80 ( diastolic) bottom number or below.      ----Avoid cold medicines with D or DM at the end of them----

## 2024-07-28 ENCOUNTER — Ambulatory Visit: Payer: Self-pay | Admitting: Nurse Practitioner

## 2024-07-28 LAB — NMR, LIPOPROFILE
Cholesterol, Total: 82 mg/dL — ABNORMAL LOW (ref 100–199)
HDL Particle Number: 38.4 umol/L (ref >=30.5)
HDL-C: 47 mg/dL (ref >39)
LDL Particle Number: <300 nmol/L (ref <1000)
LDL-C (NIH Calc): 8 mg/dL (ref 0–99)
LP-IR Score: 65 — ABNORMAL HIGH (ref <=45)
Small LDL Particle Number: <90 nmol/L (ref <=527)
Triglycerides: 166 mg/dL — ABNORMAL HIGH (ref 0–149)

## 2024-07-28 LAB — LIPOPROTEIN A (LPA): Lipoprotein (a): 10.6 nmol/L (ref <75.0)

## 2024-09-19 ENCOUNTER — Other Ambulatory Visit: Payer: Self-pay

## 2024-09-19 ENCOUNTER — Encounter: Payer: Self-pay | Admitting: Neurology

## 2024-09-19 ENCOUNTER — Ambulatory Visit: Admitting: Neurology

## 2024-09-19 VITALS — BP 146/90 | HR 61 | Ht 69.0 in | Wt 198.0 lb

## 2024-09-19 DIAGNOSIS — G7000 Myasthenia gravis without (acute) exacerbation: Secondary | ICD-10-CM

## 2024-09-19 DIAGNOSIS — E099 Drug or chemical induced diabetes mellitus without complications: Secondary | ICD-10-CM | POA: Diagnosis not present

## 2024-09-19 DIAGNOSIS — T380X5A Adverse effect of glucocorticoids and synthetic analogues, initial encounter: Secondary | ICD-10-CM | POA: Diagnosis not present

## 2024-09-19 MED ORDER — MYCOPHENOLATE MOFETIL 500 MG PO TABS: 1000.0000 mg | ORAL_TABLET | Freq: Two times a day (BID) | ORAL | 3 refills | Status: AC

## 2024-09-19 NOTE — Progress Notes (Signed)
 [name]  Provider:  Dr Balraj Brayfield Referring Provider: Jolinda Norene HERO, DO Primary Care Physician:  Jolinda Norene HERO, DO  Chief Complaint  Patient presents with   RM 1    Patient is here with wife for Myasthenia gravis - patient needs medication refilled     HPI:  Kenneth Fields is a 63 y.o. male and seen here on 09/19/2024 upon TOC  from Dr. Ines. Has seen me in 2021 for a sleep study/ consult.  His HST  in 2021 confirmed the presence of moderate OSA (obstructive sleep apnea) with REM dependency, as well as loud snoring- but no hypoxemia.  I would choose CPAP as the first treatment choice, auto CPAP at 5-15 cm water, with mask of choice and heated humidification. Mask to be fitted in reclined position. Since this patient has Myasthenia gravis, he is less well served with a dental device.  The patient never received a CPAP ( supply chain issue post pandemic) and when it arrived it was too expensive ) .  He is here simply to refill Myasthenia Gravis. Cellcept .   Smoking actively 1 PPD, does not want to quit - is not looking for help.   Provider:  Dr Ines Requesting Provider: Jolinda Norene HERO, DO Primary Care Provider:  Jolinda Norene HERO, DO   CC:  Ocular Myasthenia Gravis, neck pain (cervical radic), dizziness   12/26/2022: Patient is here for myasthenia gravis doing excellent on CellCept .  Still ocular, he says he is doing well, no diplopia no shortness of breath no generalization no nasal quality to the voice, sometimes his right eyelid may droop a little bit but very rarely, he has not been sick, no side effects of the medications, he follows with PCP very closely for management of lab work, medication is working well, no side effects, no diplopia, he gets a good night sleep, his right cervical radiculopathy is feeling better since he had injections, no coughing or choking, he is working plays golf, losing weights managing diabetes and no other complaints.   Will see him back in a year and refill his CellCept .   Patient complains of symptoms per HPI as well as the following symptoms: myasthenia gravis . Pertinent negatives and positives per HPI. All others negative   11/16/2020: mri cervical spine: COMPARISON:  None.   FINDINGS: Alignment: There is no significant anteroposterior listhesis.   Vertebrae: Vertebral body heights are maintained. There is degenerative endplate irregularity at C6-C7 and C7-T1. Chronic appearing degenerative endplate marrow changes at these levels. There is marrow edema associated with the right C5-C6 facets.   Cord: Normal caliber and signal.   Posterior Fossa, vertebral arteries, paraspinal tissues: Unremarkable.   Disc levels:   C2-C3: Right greater than left uncovertebral and facet hypertrophy. No canal stenosis. Mild foraminal stenosis.   C3-C4: Facet hypertrophy. No canal stenosis. Mild foraminal stenosis.   C4-C5: Disc bulge with endplate osteophytes. Uncovertebral hypertrophy. Right greater than left facet hypertrophy with right joint effusion. No canal stenosis. Mild to moderate right foraminal stenosis. No left foraminal stenosis.   C5-C6: Disc bulge with endplate osteophytes. Uncovertebral hypertrophy. Right greater than facet hypertrophy. Minor canal stenosis. Moderate to marked foraminal stenosis.   C6-C7: Disc bulge with endplate osteophytes. No canal stenosis. No right foraminal stenosis. Moderate left foraminal stenosis.   C7-T1: Disc bulge with endplate osteophytes. No canal stenosis. No right foraminal stenosis. Mild left foraminal stenosis.   IMPRESSION: Multilevel degenerative changes as detailed above. No high-grade canal stenosis. Foraminal  narrowing is greatest at C5-C6. Right greater than left facet arthropathy.   12/23/2021: Since injections no problems. He went to ENT for the vertigo, he does the exercises and it goes away. He has not been dizzy anymore. ENT suggested a  hearing test. He hasn't ben dizzy in a while. No symptoms, medication is working well, no problems with double or droopy eyelids, his right eye sometimes when he is tired it may droop but nothing significant and better with a good night's sleep. He is working every day. He feels weaker, he is not as strong. Can get up of the floor, can pick up things but nothing as heavy. No problems swallowing or breathing. He eats fast if he has a big mouthful he may need to drink no choking or coughing. He has started working out, he hadn't worked out in a long time and that may be it. No shortness of breath, working fine every day, plays golf. No side effects to the medications. Managing diabetes, has lost 5 pounds. No other complaints.    Patient complains of symptoms per HPI as well as the following symptoms: none . Pertinent negatives and positives per HPI. All others negative     Interval story 08/27/2021: Kenneth Fields is a 63 y.o. male here as requested by Jolinda Norene HERO, DO for neck pain and arm pain and weakness. Stable myasthenia gravis.  He has chronic neck pain,    Review of Systems: Out of a complete 14 system review, the patient complains of only the following symptoms, and all other reviewed systems are negative.   Social History   Socioeconomic History   Marital status: Married    Spouse name: Not on file   Number of children: Not on file   Years of education: Not on file   Highest education level: 11th grade  Occupational History   Not on file  Tobacco Use   Smoking status: Every Day    Current packs/day: 1.00    Average packs/day: 1 pack/day for 40.0 years (40.0 ttl pk-yrs)    Types: Cigarettes   Smokeless tobacco: Never   Tobacco comments:    Smoking 1 PPD, does not want to quit  Vaping Use   Vaping status: Never Used  Substance and Sexual Activity   Alcohol use: Yes    Alcohol/week: 30.0 standard drinks of alcohol    Types: 18 Cans of beer, 12 Shots of liquor per week     Comment: 3 beers per night   Drug use: Yes   Sexual activity: Not on file  Other Topics Concern   Not on file  Social History Narrative   Live at home with his wife   Right handed   Caffeine: 1 diet mtn dew daily, occasional tea (decaf), 1 cup of coffee in the mornings (no coffee when it's warm outside)   Social Drivers of Health   Tobacco Use: High Risk (09/19/2024)   Patient History    Smoking Tobacco Use: Every Day    Smokeless Tobacco Use: Never    Passive Exposure: Not on file  Financial Resource Strain: Low Risk (12/15/2023)   Overall Financial Resource Strain (CARDIA)    Difficulty of Paying Living Expenses: Not hard at all  Food Insecurity: No Food Insecurity (12/15/2023)   Hunger Vital Sign    Worried About Running Out of Food in the Last Year: Never true    Ran Out of Food in the Last Year: Never true  Transportation Needs: No  Transportation Needs (12/15/2023)   PRAPARE - Administrator, Civil Service (Medical): No    Lack of Transportation (Non-Medical): No  Physical Activity: Sufficiently Active (12/15/2023)   Exercise Vital Sign    Days of Exercise per Week: 3 days    Minutes of Exercise per Session: 50 min  Stress: No Stress Concern Present (12/15/2023)   [employer] of Occupational Health - Occupational Stress Questionnaire    Feeling of Stress : Not at all  Social Connections: Moderately Integrated (12/15/2023)   Social Connection and Isolation Panel    Frequency of Communication with Friends and Family: Twice a week    Frequency of Social Gatherings with Friends and Family: Three times a week    Attends Religious Services: Never    Active Member of Clubs or Organizations: Yes    Attends Banker Meetings: More than 4 times per year    Marital Status: Married  Catering Manager Violence: Not At Risk (12/15/2023)   Humiliation, Afraid, Rape, and Kick questionnaire    Fear of Current or Ex-Partner: No    Emotionally Abused: No     Physically Abused: No    Sexually Abused: No  Depression (PHQ2-9): Low Risk (06/20/2024)   Depression (PHQ2-9)    PHQ-2 Score: 1  Alcohol Screen: Not on file  Housing: Unknown (12/15/2023)   Housing Stability Vital Sign    Unable to Pay for Housing in the Last Year: No    Number of Times Moved in the Last Year: Not on file    Homeless in the Last Year: No  Utilities: Not At Risk (12/15/2023)   [facility] Utilities    Threatened with loss of utilities: No  Health Literacy: Adequate Health Literacy (12/15/2023)   B1300 Health Literacy    Frequency of need for help with medical instructions: Never    Family History  Problem Relation Age of Onset   Lung cancer Mother    Cancer Mother    Yvone' disease Mother    Myasthenia gravis Neg Hx    Seizures Neg Hx    Stroke Neg Hx    Migraines Neg Hx    Sleep apnea Neg Hx     Past Medical History:  Diagnosis Date   COVID-19 08/2020   Diabetes (HCC)    High cholesterol    Hypertension    Myasthenia gravis (HCC)    Myasthenia gravis (HCC)     Past Surgical History:  Procedure Laterality Date   ELBOW SURGERY Right    for tendonitis   TYMPANOSTOMY TUBE PLACEMENT Bilateral    as a child    WISDOM TOOTH EXTRACTION      Current Outpatient Medications  Medication Sig Dispense Refill   aspirin  EC 81 MG tablet Take 1 tablet (81 mg total) by mouth daily. Swallow whole.     cetirizine (ZYRTEC) 10 MG tablet Take 10 mg by mouth daily as needed for allergies.     diltiazem  (CARDIZEM  CD) 120 MG 24 hr capsule Take 120 mg by mouth daily.     Evolocumab  (REPATHA  SURECLICK) 140 MG/ML SOAJ Inject 140 mg into the skin every 14 (fourteen) days. 6 mL 3   hydrochlorothiazide  (HYDRODIURIL ) 25 MG tablet Take 1 tablet (25 mg total) by mouth daily. 90 tablet 3   meclizine  (ANTIVERT ) 25 MG tablet Take 1 tablet (25 mg total) by mouth 3 (three) times daily as needed for dizziness. 90 tablet 6   metFORMIN  (GLUCOPHAGE -XR) 750 MG 24 hr tablet TAKE 2  TABLETS BY MOUTH  ONCE DAILY WITH BREAKFAST 180 tablet 3   mycophenolate  (CELLCEPT ) 500 MG tablet Take 2 tablets (1,000 mg total) by mouth 2 (two) times daily. 360 tablet 5   Omega-3 1000 MG CAPS Take 1 g by mouth.     pantoprazole  (PROTONIX ) 40 MG tablet Take 1 tablet (40 mg total) by mouth daily. 90 tablet 3   rosuvastatin  (CRESTOR ) 10 MG tablet Take 1 tablet (10 mg total) by mouth daily. 90 tablet 3   sildenafil  (VIAGRA ) 100 MG tablet Take 0.5-1 tablets (50-100 mg total) by mouth daily as needed for erectile dysfunction. 5 tablet 11   zolpidem  (AMBIEN  CR) 6.25 MG CR tablet Take 1 tablet (6.25 mg total) by mouth at bedtime as needed for sleep. 30 tablet 0   azelastine  (ASTELIN ) 0.1 % nasal spray Place 1 spray into both nostrils 2 (two) times daily. (Patient not taking: Reported on 09/19/2024) 30 mL 12   No current facility-administered medications for this visit.    Allergies as of 09/19/2024 - Review Complete 09/19/2024  Allergen Reaction Noted   Bee venom Anaphylaxis 11/15/2020   Chantix [varenicline tartrate]  04/25/2019   Losartan   11/04/2023    Vitals: BP (!) 146/90 (Cuff Size: Normal) Comment: Did not take BP meds today, but took it yesterday  Pulse 61   Ht 5' 9 (1.753 m)   Wt 198 lb (89.8 kg)   SpO2 97%   BMI 29.24 kg/m       Physical exam:  General: The patient is awake, alert and appears not in acute distress.  The patient is well groomed. Head: Normocephalic, atraumatic.  Neck is supple.   Cardiovascular:  Regular rate and palpable peripheral pulse:  Respiratory: clear to auscultation.  Mallampati: 3, Skin:  Without  edema,  Has Abdominal girth.    Neurologic exam : The patient is awake and alert, oriented to place and time.   Memory subjective  described as intact.  There is a normal attention span & concentration ability.  Speech is fluent with dysphonia  Mood and affect are appropriate.  Cranial nerves: Pupils are equal and briskly reactive to light.  Funduscopic  exam ; no evidence of pallor or edema.  Extraocular movements  in vertical and horizontal planes intact and without nystagmus. Visual fields by finger perimetry are intact. Hearing to finger rub intact.  Facial sensation intact to fine touch. Facial motor strength is symmetric and tongue and uvula move midline.  Motor exam:   Normal tone and normal muscle bulk and symmetric normal strength in all extremities. Grip Strength equal and good.  Proximal strength of shoulder muscles and hip flexors was intact.  Sensory:  Fine touch and vibration were tested .  Proprioception was tested in the upper extremities only and was  normal.  Coordination: Rapid alternating movements in the fingers/hands were normal.  Finger-to-nose maneuver was tested and showed no evidence of ataxia, dysmetria , has a resting tremor.  Gait and station: Patient walked with/ without assistive device .  Core Strength within normal limits.  Deep tendon reflexes: in the  upper and lower extremities are symmetric.   Assessment: Total time for face to face interview and examination, for review of  images and laboratory testing, neurophysiology testing and pre-existing records, including out-of -network , was 30 minutes. Assessment is as follows here:  1)  Myasthenia  has only affected eye lid ( ptosis and caused diplopia with skewed vision).  He now can drive again, has a  drug induced tremor .   His neck pain is cured (!)  His diabetic neuropathy is stabilized.    Plan:  Treatment plan and additional workup planned after today includes:   1) just refills of Cellcept . 2 bid .  CBC diff  and CMET.  He goes through [facility] [place] , 120 capsule a month.  Gets 3 months at a time.  RV in 12 months.     The patient's condition requires frequent monitoring and adjustments in the treatment plan, reflecting the ongoing complexity of care.  This provider is the continuing focal point for all needed services for this  condition.   Dedra Gores, MD  [name] Board certified by The [employer] of Sleep Medicine and Diplomate of the [name] of Sleep Medicine. Board certified In Neurology through the ABPN, Fellow of the [name] of Neu[employer].

## 2024-09-19 NOTE — Patient Instructions (Signed)
 Myasthenia Gravis Myasthenia gravis (MG) is a long-term, or chronic, condition that causes weakness in the muscles you can control (voluntary muscles). MG can affect any voluntary muscle. The muscles most often affected are the ones that control: Eye movement. Facial movements. Swallowing. MG is a disease in which the body's disease-fighting system (immune system) attacks its own healthy tissues. This type of disease is known as an autoimmune disease. What are the causes? This condition is caused by a type of protein (antibody) that interferes with how the nerves and muscles communicate. When you have MG, your immune system makes antibodies that block a chemical called acetylcholine that your body needs to send nerve signals to your muscles. This causes muscle weakness. What increases the risk? The following factors may make you more likely to develop this condition: Having an enlarged thymus gland. The thymus gland is located under the breastbone. It makes certain cells for the immune system. Having a family history of MG. What are the signs or symptoms? Symptoms of MG may include: Drooping eyelids. Double vision. Muscle weakness that gets worse with activity and gets better after rest. Trouble chewing and swallowing. Trouble making facial expressions. Slurred speech. Difficulty walking. Weakness of the arms, hands, and legs. Sudden, severe difficulty breathing (myasthenic crisis) may develop after having: An infection. A fever. A bad reaction to a medicine. Myasthenic crisis requires emergency breathing support. Sometimes symptoms of MG go away for a while (remission) and then come back later. How is this diagnosed? This condition may be diagnosed based on: Your symptoms and medical history. A physical exam. Blood tests. Tests of your muscle strength and function. Imaging tests, such as a CT scan of the chest. How is this treated? The goal of treatment is to improve muscle  strength. Treatment may include: Taking medicine. Doing physical therapy exercises to gain strength. Having surgery to remove the thymus gland (thymectomy). This may result in a long remission for some people. Having a procedure to remove the acetylcholine antibodies (plasmapheresis). Getting emergency breathing support, if you experience myasthenic crisis. If you experience remission, you may be able to stop treatment and then resume treatment when your symptoms return. Follow these instructions at home:  Take over-the-counter and prescription medicines only as told by your health care provider. Get plenty of rest and sleep. Take frequent breaks to rest your eyes, especially when in bright light or working on a computer. Maintain a healthy diet and a healthy weight. Work with your health care provider or a dietitian if you need help. Do exercises as told by your health care provider or physical therapist. Do not use any products that contain nicotine or tobacco. These products include cigarettes, chewing tobacco, and vaping devices, such as e-cigarettes. If you need help quitting, ask your health care provider. Prevent infections by: Washing your hands often with soap and water for at least 20 seconds. If soap and water are not available, use hand sanitizer. Avoiding contact with other people who are sick. Avoiding touching your eyes, nose, and mouth. Using a disinfectant to regularly clean surfaces in your home that are touched often. Keep all follow-up visits. This is important. Where to find more information [employer] [facility] [employer] [facility] Stroke: [email or web address] Contact a health care provider if: Your symptoms change or get worse, especially after having a fever or infection. Get help right away if: You have trouble breathing. You develop new swallowing problems. You develop persistent double vision. These symptoms may be an emergency. Get help  right away. Call  911. Do not wait to see if the symptoms will go away. Do not drive yourself to the hospital. Summary Myasthenia gravis (MG) is a long-term, or chronic, condition that causes weakness in the muscles you can control (voluntary muscles). A symptom of MG is muscle weakness that gets worse with activity and gets better after rest. Sudden, severe difficulty breathing (myasthenic crisis) may develop after having an infection, a fever, or a bad reaction to a medicine. The goal of treatment is to improve muscle strength. Treatment may include medicines, lifestyle changes, physical therapy, surgery, plasmapheresis, or emergency breathing support. This information is not intended to replace advice given to you by your health care provider. Make sure you discuss any questions you have with your health care provider. Document Revised: 06/28/2021 Document Reviewed: 06/28/2021 Elsevier Patient Education  2024 [employer]  Mycophenolate  Capsules What is this medication? MYCOPHENOLATE  (mye koe FEN oh late) prevents the body from rejecting an organ transplant. It works by lowering the body's immune system response. This helps the body accept the donor organ. It belongs to a group of medications called immunosuppressants. This medicine may be used for other purposes; ask your health care provider or pharmacist if you have questions. COMMON BRAND NAME(S): CellCept  What should I tell my care team before I take this medication? They need to know if you have any of these conditions: Anemia Blood disorder Cancer Diarrhea Immune system problems Infection, such as chickenpox, cold sores, herpes Kidney disease Recent or upcoming vaccine Stomach problems An unusual or allergic reaction to mycophenolate , other medications, foods, dyes, or preservatives Pregnant or trying to get pregnant Breastfeeding How should I use this medication? Take this medication by mouth with a full glass of water. Follow the  directions on the prescription label. Take this medication on an empty stomach, at least 1 hour before or 2 hours after food. Do not take with food unless your care team approves. Swallow the medication whole. Do not cut, crush, or chew the medication. If the medication is broken or is not intact, do not get the powder on your skin or eyes. If contact occurs, rinse thoroughly with water. Take your medication at regular intervals. Do not take your medication more often than directed. Do not stop taking except on your care team's advice. A special MedGuide will be given to you by the pharmacist with each prescription and refill. Be sure to read this information carefully each time. Talk to your care team about the use of this medication in children. While this medication may be prescribed for selected conditions, precautions do apply. Overdosage: If you think you have taken too much of this medicine contact a poison control center or emergency room at once. NOTE: This medicine is only for you. Do not share this medicine with others. What if I miss a dose? If you miss a dose, take it as soon as you can. If it is almost time for your next dose, take only that dose. Do not take double or extra doses. What may interact with this medication? Do not take this medication with any of the following: Live virus vaccines This medication may also interact with the following: Acyclovir or valacyclovir Azathioprine Certain antibiotics, such as ciprofloxacin, levofloxacin, norfloxacin, trimethoprim; sulfamethoxazole, penicillin, amoxicillin ; clavulanic acid Certain medications for stomach problems, such as lansoprazole, omeprazole, pantoprazole  Cyclosporine Estrogen and progestin hormones Ganciclovir or valganciclovir Isavuconazonium Medications for cholesterol, such as cholestyramine or colestipol Metronidazole Other medications that contain mycophenolate  Probenecid  Rifampin Sevelamer Stomach acid blockers,  such as magnesium hydroxide or aluminum hydroxide Telmisartan This list may not describe all possible interactions. Give your health care provider a list of all the medicines, herbs, non-prescription drugs, or dietary supplements you use. Also tell them if you smoke, drink alcohol, or use illegal drugs. Some items may interact with your medicine. What should I watch for while using this medication? Visit your care team for regular checks on your progress. You will need frequent blood checks during the first few months you are receiving the medication. This medication may affect your coordination, reaction time, or judgment. Do not drive or operate machinery until you know how this medication affects you. Sit up or stand slowly to reduce the risk of dizzy or fainting spells. Drinking alcohol with this medication can increase the risk of these side effects. This medication may increase your risk of getting an infection. Call your care team for advice if you get a fever, chills, sore throat, or other symptoms of a cold or flu. Do not treat yourself. Try to avoid being around people who are sick. This medication can make you more sensitive to the sun. Keep out of the sun. If you cannot avoid being in the sun, wear protective clothing and sunscreen. Do not use sun lamps, tanning beds, or tanning booths. Talk to your care team about your risk of cancer. You may be more at risk for certain types of cancer if you take this medication. Talk to your care team if you or your partner may be pregnant. Serious birth defects can occur if you take this medication during pregnancy and for 6 weeks after the last dose. You will need a negative pregnancy test before starting this medication. Estrogen and progestin hormones may not work as well while you are taking this medication. Contraception is recommended while taking this medication and for 6 weeks after the last dose. Your care team can help you find the option that  works for you. If your partner can get pregnant, use a condom during sex while taking this medication and for 90 days after the last dose. Talk to your care team before breastfeeding. Changes to your treatment plan may be needed. Do not donate sperm while taking this medication and for 90 days after the last dose. Do not donate blood while you are taking this medication and for 6 weeks after the last dose. Donated blood may contain enough of this medication to cause birth defects in a fetus if transfused to someone who is pregnant. What side effects may I notice from receiving this medication? Side effects that you should report to your care team as soon as possible: Allergic reactions--skin rash, itching, hives, swelling of the face, lips, tongue, or throat Infection--fever, chills, cough, sore throat, wounds that don't heal, pain or trouble when passing urine, general feeling of discomfort or being unwell Joint, muscle, or tendon pain, swelling, or stiffness Low red blood cell level--unusual weakness or fatigue, dizziness, headache, trouble breathing Peptic ulcer--burning stomach pain, loss of appetite, bloating, burping, heartburn, nausea, vomiting Stomach bleeding--bloody or black, tar-like stools, vomiting blood or brown material that looks like coffee grounds Stomach pain that is severe, does not go away, or gets worse Unusual bruising or bleeding Side effects that usually do not require medical attention (report to your care team if they continue or are bothersome): Diarrhea Dizziness Headache Nausea Swelling of the ankles, hands, or feet Tremors or shaking Trouble sleeping This list may not  describe all possible side effects. Call your doctor for medical advice about side effects. You may report side effects to FDA at 1-800-FDA-1088. Where should I keep my medication? Keep out of the reach of children and pets. Store at room temperature between 20 and 25 degrees C (68 and 77 degrees  F). Get rid of any unused medication after the expiration date. To get rid of medications that are no longer needed or have expired: Take the medication to a medication take-back program. Check with your pharmacy or law enforcement to find a location. If you cannot return the medication, ask your pharmacist or care team how to get rid of this medication safely. NOTE: This sheet is a summary. It may not cover all possible information. If you have questions about this medicine, talk to your doctor, pharmacist, or health care provider.  2024 Elsevier/Gold Standard (2022-07-17 00:00:00)

## 2024-09-20 ENCOUNTER — Ambulatory Visit: Payer: Self-pay | Admitting: Neurology

## 2024-09-20 LAB — COMPREHENSIVE METABOLIC PANEL WITH GFR
ALT: 20 IU/L (ref 0–44)
AST: 22 IU/L (ref 0–40)
Albumin: 4.6 g/dL (ref 3.9–4.9)
Alkaline Phosphatase: 83 IU/L (ref 47–123)
BUN/Creatinine Ratio: 16 (ref 10–24)
BUN: 12 mg/dL (ref 8–27)
Bilirubin Total: 0.4 mg/dL (ref 0.0–1.2)
CO2: 20 mmol/L (ref 20–29)
Calcium: 9.8 mg/dL (ref 8.6–10.2)
Chloride: 103 mmol/L (ref 96–106)
Creatinine, Ser: 0.75 mg/dL — ABNORMAL LOW (ref 0.76–1.27)
Globulin, Total: 1.8 g/dL (ref 1.5–4.5)
Glucose: 107 mg/dL — ABNORMAL HIGH (ref 70–99)
Potassium: 4.4 mmol/L (ref 3.5–5.2)
Sodium: 141 mmol/L (ref 134–144)
Total Protein: 6.4 g/dL (ref 6.0–8.5)
eGFR: 101 mL/min/1.73

## 2024-09-20 LAB — CBC WITH DIFFERENTIAL/PLATELET
Basophils Absolute: 0.1 x10E3/uL (ref 0.0–0.2)
Basos: 1 %
EOS (ABSOLUTE): 0.2 x10E3/uL (ref 0.0–0.4)
Eos: 2 %
Hematocrit: 47.1 % (ref 37.5–51.0)
Hemoglobin: 16.1 g/dL (ref 13.0–17.7)
Immature Grans (Abs): 0 x10E3/uL (ref 0.0–0.1)
Immature Granulocytes: 0 %
Lymphocytes Absolute: 2.3 x10E3/uL (ref 0.7–3.1)
Lymphs: 23 %
MCH: 32.1 pg (ref 26.6–33.0)
MCHC: 34.2 g/dL (ref 31.5–35.7)
MCV: 94 fL (ref 79–97)
Monocytes Absolute: 0.5 x10E3/uL (ref 0.1–0.9)
Monocytes: 5 %
Neutrophils Absolute: 6.9 x10E3/uL (ref 1.4–7.0)
Neutrophils: 69 %
Platelets: 247 x10E3/uL (ref 150–450)
RBC: 5.01 x10E6/uL (ref 4.14–5.80)
RDW: 13.1 % (ref 11.6–15.4)
WBC: 10 x10E3/uL (ref 3.4–10.8)

## 2024-09-21 NOTE — Telephone Encounter (Addendum)
 Called pt per Dr. Chalice with the below lab results. Pt verbalized understanding,and thanked me for calling.   ----- Message from Dedra Chalice, MD sent at 09/20/2024  5:24 PM EST ----- Normal CMET ( patient had eaten, so glucose is not valid)

## 2025-01-30 ENCOUNTER — Encounter: Payer: Self-pay | Admitting: Family Medicine

## 2025-09-19 ENCOUNTER — Ambulatory Visit: Admitting: Neurology
